# Patient Record
Sex: Female | Born: 1975 | Marital: Single | Smoking: Former smoker
Health system: Southern US, Community
[De-identification: ages and names within clinical notes are randomized; demographics above are authoritative.]

## PROBLEM LIST (undated history)

## (undated) DIAGNOSIS — D649 Anemia, unspecified: Secondary | ICD-10-CM

## (undated) DIAGNOSIS — I639 Cerebral infarction, unspecified: Secondary | ICD-10-CM

## (undated) DIAGNOSIS — F17200 Nicotine dependence, unspecified, uncomplicated: Secondary | ICD-10-CM

## (undated) DIAGNOSIS — E669 Obesity, unspecified: Secondary | ICD-10-CM

## (undated) DIAGNOSIS — E66812 Obesity, class 2: Secondary | ICD-10-CM

## (undated) DIAGNOSIS — F1010 Alcohol abuse, uncomplicated: Secondary | ICD-10-CM

## (undated) DIAGNOSIS — I10 Essential (primary) hypertension: Secondary | ICD-10-CM

## (undated) HISTORY — DX: Nicotine dependence, unspecified, uncomplicated: F17.200

## (undated) HISTORY — DX: Anemia, unspecified: D64.9

## (undated) HISTORY — DX: Obesity, class 2: E66.812

## (undated) HISTORY — DX: Obesity, unspecified: E66.9

## (undated) HISTORY — DX: Cerebral infarction, unspecified: I63.9

## (undated) HISTORY — DX: Alcohol abuse, uncomplicated: F10.10

## (undated) HISTORY — DX: Essential (primary) hypertension: I10

---

## 2017-08-15 ENCOUNTER — Encounter (HOSPITAL_COMMUNITY)
Admission: AD | Disposition: A | Discharge status: Home / Self Care | Payer: Self-pay | Source: Other Acute Inpatient Hospital | Attending: Neurology

## 2017-08-15 ENCOUNTER — Inpatient Hospital Stay (HOSPITAL_COMMUNITY)
Admission: AD | Admit: 2017-08-15 | Discharge: 2017-08-20 | DRG: 023 | Disposition: A | Discharge status: Home / Self Care | Payer: Medicaid Other | Source: Other Acute Inpatient Hospital | Attending: Neurology | Admitting: Neurology

## 2017-08-15 ENCOUNTER — Observation Stay (HOSPITAL_COMMUNITY): Payer: Medicaid Other

## 2017-08-15 ENCOUNTER — Inpatient Hospital Stay (HOSPITAL_COMMUNITY): Payer: Medicaid Other

## 2017-08-15 ENCOUNTER — Observation Stay (HOSPITAL_COMMUNITY): Payer: Medicaid Other | Admitting: Certified Registered"

## 2017-08-15 DIAGNOSIS — I10 Essential (primary) hypertension: Secondary | ICD-10-CM | POA: Diagnosis present

## 2017-08-15 DIAGNOSIS — I63411 Cerebral infarction due to embolism of right middle cerebral artery: Principal | ICD-10-CM | POA: Diagnosis present

## 2017-08-15 DIAGNOSIS — I639 Cerebral infarction, unspecified: Secondary | ICD-10-CM

## 2017-08-15 DIAGNOSIS — N3090 Cystitis, unspecified without hematuria: Secondary | ICD-10-CM | POA: Diagnosis present

## 2017-08-15 DIAGNOSIS — J9811 Atelectasis: Secondary | ICD-10-CM | POA: Diagnosis present

## 2017-08-15 DIAGNOSIS — E876 Hypokalemia: Secondary | ICD-10-CM | POA: Diagnosis not present

## 2017-08-15 DIAGNOSIS — I618 Other nontraumatic intracerebral hemorrhage: Secondary | ICD-10-CM | POA: Diagnosis not present

## 2017-08-15 DIAGNOSIS — I6601 Occlusion and stenosis of right middle cerebral artery: Secondary | ICD-10-CM | POA: Diagnosis present

## 2017-08-15 DIAGNOSIS — F1020 Alcohol dependence, uncomplicated: Secondary | ICD-10-CM | POA: Diagnosis present

## 2017-08-15 DIAGNOSIS — Z6837 Body mass index (BMI) 37.0-37.9, adult: Secondary | ICD-10-CM

## 2017-08-15 DIAGNOSIS — Z781 Physical restraint status: Secondary | ICD-10-CM

## 2017-08-15 DIAGNOSIS — E669 Obesity, unspecified: Secondary | ICD-10-CM | POA: Diagnosis present

## 2017-08-15 DIAGNOSIS — I63511 Cerebral infarction due to unspecified occlusion or stenosis of right middle cerebral artery: Secondary | ICD-10-CM

## 2017-08-15 DIAGNOSIS — F1010 Alcohol abuse, uncomplicated: Secondary | ICD-10-CM | POA: Diagnosis present

## 2017-08-15 DIAGNOSIS — N159 Renal tubulo-interstitial disease, unspecified: Secondary | ICD-10-CM | POA: Diagnosis present

## 2017-08-15 DIAGNOSIS — I959 Hypotension, unspecified: Secondary | ICD-10-CM | POA: Diagnosis not present

## 2017-08-15 DIAGNOSIS — Z9282 Status post administration of tPA (rtPA) in a different facility within the last 24 hours prior to admission to current facility: Secondary | ICD-10-CM | POA: Diagnosis not present

## 2017-08-15 DIAGNOSIS — D509 Iron deficiency anemia, unspecified: Secondary | ICD-10-CM | POA: Diagnosis present

## 2017-08-15 DIAGNOSIS — I63031 Cerebral infarction due to thrombosis of right carotid artery: Secondary | ICD-10-CM

## 2017-08-15 DIAGNOSIS — R29714 NIHSS score 14: Secondary | ICD-10-CM | POA: Diagnosis present

## 2017-08-15 DIAGNOSIS — E538 Deficiency of other specified B group vitamins: Secondary | ICD-10-CM | POA: Diagnosis present

## 2017-08-15 DIAGNOSIS — J9601 Acute respiratory failure with hypoxia: Secondary | ICD-10-CM | POA: Diagnosis present

## 2017-08-15 DIAGNOSIS — E785 Hyperlipidemia, unspecified: Secondary | ICD-10-CM | POA: Diagnosis present

## 2017-08-15 DIAGNOSIS — D62 Acute posthemorrhagic anemia: Secondary | ICD-10-CM | POA: Diagnosis not present

## 2017-08-15 DIAGNOSIS — F17200 Nicotine dependence, unspecified, uncomplicated: Secondary | ICD-10-CM | POA: Diagnosis present

## 2017-08-15 DIAGNOSIS — G8194 Hemiplegia, unspecified affecting left nondominant side: Secondary | ICD-10-CM | POA: Diagnosis present

## 2017-08-15 DIAGNOSIS — Z9911 Dependence on respirator [ventilator] status: Secondary | ICD-10-CM

## 2017-08-15 DIAGNOSIS — D72829 Elevated white blood cell count, unspecified: Secondary | ICD-10-CM | POA: Diagnosis present

## 2017-08-15 DIAGNOSIS — R0602 Shortness of breath: Secondary | ICD-10-CM

## 2017-08-15 DIAGNOSIS — N390 Urinary tract infection, site not specified: Secondary | ICD-10-CM | POA: Diagnosis present

## 2017-08-15 HISTORY — DX: Cerebral infarction, unspecified: I63.9

## 2017-08-15 HISTORY — DX: Occlusion and stenosis of right middle cerebral artery: I66.01

## 2017-08-15 HISTORY — PX: IR CT HEAD LTD: IMG2386

## 2017-08-15 HISTORY — PX: RADIOLOGY WITH ANESTHESIA: SHX6223

## 2017-08-15 HISTORY — PX: IR PERCUTANEOUS ART THROMBECTOMY/INFUSION INTRACRANIAL INC DIAG ANGIO: IMG6087

## 2017-08-15 LAB — LIPID PANEL
CHOL/HDL RATIO: 5.5 ratio
CHOLESTEROL: 127 mg/dL (ref 0–200)
HDL: 23 mg/dL — AB (ref >40)
LDL CALC: 62 mg/dL (ref 0–99)
Triglycerides: 208 mg/dL — ABNORMAL HIGH (ref <150)
VLDL: 42 mg/dL — AB (ref 0–40)

## 2017-08-15 LAB — COMPREHENSIVE METABOLIC PANEL
ALT: 17 U/L (ref 0–44)
AST: 22 U/L (ref 15–41)
Albumin: 2.7 g/dL — ABNORMAL LOW (ref 3.5–5.0)
Alkaline Phosphatase: 84 U/L (ref 38–126)
Anion gap: 8 (ref 5–15)
BUN: <5 mg/dL — ABNORMAL LOW (ref 6–20)
CO2: 19 mmol/L — ABNORMAL LOW (ref 22–32)
Calcium: 7.5 mg/dL — ABNORMAL LOW (ref 8.9–10.3)
Chloride: 112 mmol/L — ABNORMAL HIGH (ref 98–111)
Creatinine, Ser: 0.45 mg/dL (ref 0.44–1.00)
GFR calc Af Amer: >60 mL/min (ref >60)
GFR calc non Af Amer: >60 mL/min (ref >60)
Glucose, Bld: 109 mg/dL — ABNORMAL HIGH (ref 70–99)
POTASSIUM: 3.6 mmol/L (ref 3.5–5.1)
SODIUM: 139 mmol/L (ref 135–145)
Total Bilirubin: 0.3 mg/dL (ref 0.3–1.2)
Total Protein: 6.4 g/dL — ABNORMAL LOW (ref 6.5–8.1)

## 2017-08-15 LAB — GLUCOSE, CAPILLARY
GLUCOSE-CAPILLARY: 92 mg/dL (ref 70–99)
Glucose-Capillary: 92 mg/dL (ref 70–99)
Glucose-Capillary: 90 mg/dL (ref 70–99)

## 2017-08-15 LAB — POCT I-STAT 3, ART BLOOD GAS (G3+)
Acid-base deficit: 7 mmol/L — ABNORMAL HIGH (ref 0.0–2.0)
BICARBONATE: 18.5 mmol/L — AB (ref 20.0–28.0)
O2 Saturation: 97 %
PCO2 ART: 35.4 mmHg (ref 32.0–48.0)
Patient temperature: 98.7
TCO2: 20 mmol/L — AB (ref 22–32)
pH, Arterial: 7.325 — ABNORMAL LOW (ref 7.350–7.450)
pO2, Arterial: 92 mmHg (ref 83.0–108.0)

## 2017-08-15 LAB — IRON AND TIBC
Iron: 8 ug/dL — ABNORMAL LOW (ref 28–170)
SATURATION RATIOS: 2 % — AB (ref 10.4–31.8)
TIBC: 402 ug/dL (ref 250–450)
UIBC: 394 ug/dL

## 2017-08-15 LAB — FERRITIN: FERRITIN: 4 ng/mL — AB (ref 11–307)

## 2017-08-15 LAB — RAPID URINE DRUG SCREEN, HOSP PERFORMED
Amphetamines: NOT DETECTED
Barbiturates: NOT DETECTED
Benzodiazepines: POSITIVE — AB
Cocaine: NOT DETECTED
Opiates: NOT DETECTED
Tetrahydrocannabinol: NOT DETECTED

## 2017-08-15 LAB — HEMOGLOBIN A1C
Hgb A1c MFr Bld: 5.8 % — ABNORMAL HIGH (ref 4.8–5.6)
MEAN PLASMA GLUCOSE: 119.76 mg/dL

## 2017-08-15 LAB — MRSA PCR SCREENING: MRSA by PCR: NEGATIVE

## 2017-08-15 LAB — VITAMIN B12: Vitamin B-12: 156 pg/mL — ABNORMAL LOW (ref 180–914)

## 2017-08-15 LAB — RETICULOCYTES
RBC.: 3.9 MIL/uL (ref 3.87–5.11)
RETIC CT PCT: 1.4 % (ref 0.4–3.1)
Retic Count, Absolute: 54.6 10*3/uL (ref 19.0–186.0)

## 2017-08-15 LAB — TRIGLYCERIDES: TRIGLYCERIDES: 200 mg/dL — AB (ref <150)

## 2017-08-15 LAB — FOLATE: Folate: 12.5 ng/mL (ref >5.9)

## 2017-08-15 LAB — HIV ANTIBODY (ROUTINE TESTING W REFLEX): HIV SCREEN 4TH GENERATION: NONREACTIVE

## 2017-08-15 SURGERY — RADIOLOGY WITH ANESTHESIA
Anesthesia: General

## 2017-08-15 MED ORDER — NITROGLYCERIN 1 MG/10 ML FOR IR/CATH LAB
INTRA_ARTERIAL | Status: DC | PRN
  Administered 2017-08-15 (×5): 25 ug via INTRA_ARTERIAL

## 2017-08-15 MED ORDER — STROKE: EARLY STAGES OF RECOVERY BOOK
Freq: Once | Status: AC
  Administered 2017-08-15: 08:00:00
  Filled 2017-08-15: qty 1

## 2017-08-15 MED ORDER — ACETAMINOPHEN 650 MG RE SUPP: 650.0000 mg | RECTAL | Status: DC | PRN

## 2017-08-15 MED ORDER — VITAMIN B-1 100 MG PO TABS: 100.0000 mg | ORAL_TABLET | Freq: Every day | ORAL | Status: DC

## 2017-08-15 MED ORDER — SODIUM CHLORIDE 0.9 % IV SOLN
INTRAVENOUS | Status: DC | PRN
  Administered 2017-08-15: 04:00:00 via INTRAVENOUS

## 2017-08-15 MED ORDER — ACETAMINOPHEN 160 MG/5ML PO SOLN: 650.0000 mg | ORAL | Status: DC | PRN

## 2017-08-15 MED ORDER — PHENYLEPHRINE HCL-NACL 10-0.9 MG/250ML-% IV SOLN
0.0000 ug/min | INTRAVENOUS | Status: DC
  Filled 2017-08-15: qty 250

## 2017-08-15 MED ORDER — NICARDIPINE HCL IN NACL 20-0.86 MG/200ML-% IV SOLN: 0.0000 mg/h | INTRAVENOUS | Status: DC

## 2017-08-15 MED ORDER — ROCURONIUM BROMIDE 100 MG/10ML IV SOLN
INTRAVENOUS | Status: DC | PRN
  Administered 2017-08-15: 50 mg via INTRAVENOUS

## 2017-08-15 MED ORDER — PANTOPRAZOLE SODIUM 40 MG PO PACK
40.0000 mg | PACK | Freq: Every day | ORAL | Status: DC
  Administered 2017-08-15 – 2017-08-16 (×2): 40 mg
  Filled 2017-08-15 (×2): qty 20

## 2017-08-15 MED ORDER — FENTANYL CITRATE (PF) 100 MCG/2ML IJ SOLN
100.0000 ug | INTRAMUSCULAR | Status: DC | PRN
  Administered 2017-08-15 (×3): 100 ug via INTRAVENOUS
  Filled 2017-08-15 (×2): qty 2

## 2017-08-15 MED ORDER — ACETAMINOPHEN 325 MG PO TABS: 650.0000 mg | ORAL_TABLET | ORAL | Status: DC | PRN

## 2017-08-15 MED ORDER — CEFAZOLIN SODIUM-DEXTROSE 2-4 GM/100ML-% IV SOLN
INTRAVENOUS | Status: AC
  Filled 2017-08-15: qty 100

## 2017-08-15 MED ORDER — SODIUM CHLORIDE 0.9 % IV SOLN: 50.0000 mL/h | INTRAVENOUS | Status: DC

## 2017-08-15 MED ORDER — CEPHALEXIN 500 MG PO CAPS
500.0000 mg | ORAL_CAPSULE | Freq: Two times a day (BID) | ORAL | Status: DC
  Filled 2017-08-15: qty 1

## 2017-08-15 MED ORDER — FENTANYL CITRATE (PF) 100 MCG/2ML IJ SOLN
100.0000 ug | INTRAMUSCULAR | Status: AC | PRN
  Administered 2017-08-15 – 2017-08-16 (×3): 100 ug via INTRAVENOUS
  Filled 2017-08-15 (×5): qty 2

## 2017-08-15 MED ORDER — CEFAZOLIN SODIUM-DEXTROSE 2-3 GM-%(50ML) IV SOLR
INTRAVENOUS | Status: DC | PRN
  Administered 2017-08-15: 2 g via INTRAVENOUS

## 2017-08-15 MED ORDER — ADULT MULTIVITAMIN W/MINERALS CH: 1.0000 | ORAL_TABLET | Freq: Every day | ORAL | Status: DC

## 2017-08-15 MED ORDER — LABETALOL HCL 5 MG/ML IV SOLN: 10.0000 mg | Freq: Once | INTRAVENOUS | Status: DC | PRN

## 2017-08-15 MED ORDER — LORAZEPAM 1 MG PO TABS: 1.0000 mg | ORAL_TABLET | Freq: Four times a day (QID) | ORAL | Status: AC | PRN

## 2017-08-15 MED ORDER — IOHEXOL 300 MG/ML  SOLN
80.0000 mL | Freq: Once | INTRAMUSCULAR | Status: AC | PRN
  Administered 2017-08-16: 100 mL via INTRAVENOUS

## 2017-08-15 MED ORDER — PROPOFOL 1000 MG/100ML IV EMUL
5.0000 ug/kg/min | INTRAVENOUS | Status: DC
  Administered 2017-08-15 (×2): 50 ug/kg/min via INTRAVENOUS
  Administered 2017-08-15 (×2): 40 ug/kg/min via INTRAVENOUS
  Administered 2017-08-16: 20 ug/kg/min via INTRAVENOUS
  Administered 2017-08-16: 40 ug/kg/min via INTRAVENOUS
  Filled 2017-08-15 (×6): qty 100

## 2017-08-15 MED ORDER — CEPHALEXIN 250 MG/5ML PO SUSR
500.0000 mg | Freq: Two times a day (BID) | ORAL | Status: DC
  Filled 2017-08-15 (×2): qty 10

## 2017-08-15 MED ORDER — CEPHALEXIN 250 MG/5ML PO SUSR
500.0000 mg | Freq: Two times a day (BID) | ORAL | Status: DC
  Administered 2017-08-15 – 2017-08-16 (×2): 500 mg
  Filled 2017-08-15 (×4): qty 10

## 2017-08-15 MED ORDER — SODIUM CHLORIDE 0.9 % IV SOLN
INTRAVENOUS | Status: DC | PRN
  Administered 2017-08-15: 50 ug/min via INTRAVENOUS

## 2017-08-15 MED ORDER — THIAMINE HCL 100 MG/ML IJ SOLN
100.0000 mg | Freq: Every day | INTRAMUSCULAR | Status: DC
  Administered 2017-08-15: 100 mg via INTRAVENOUS
  Filled 2017-08-15: qty 2

## 2017-08-15 MED ORDER — FOLIC ACID 1 MG PO TABS: 1.0000 mg | ORAL_TABLET | Freq: Every day | ORAL | Status: DC

## 2017-08-15 MED ORDER — SODIUM CHLORIDE 0.9 % IV SOLN
INTRAVENOUS | Status: DC
  Administered 2017-08-15 – 2017-08-17 (×5): via INTRAVENOUS

## 2017-08-15 MED ORDER — INSULIN ASPART 100 UNIT/ML ~~LOC~~ SOLN: 0.0000 [IU] | SUBCUTANEOUS | Status: DC

## 2017-08-15 MED ORDER — CHLORHEXIDINE GLUCONATE 0.12% ORAL RINSE (MEDLINE KIT)
15.0000 mL | Freq: Two times a day (BID) | OROMUCOSAL | Status: DC
  Administered 2017-08-15 – 2017-08-17 (×4): 15 mL via OROMUCOSAL

## 2017-08-15 MED ORDER — PROPOFOL 500 MG/50ML IV EMUL
INTRAVENOUS | Status: DC | PRN
  Administered 2017-08-15: 30 ug/kg/min via INTRAVENOUS

## 2017-08-15 MED ORDER — ACETAMINOPHEN 160 MG/5ML PO SOLN
650.0000 mg | ORAL | Status: DC | PRN
  Administered 2017-08-16 – 2017-08-17 (×2): 650 mg
  Filled 2017-08-15 (×2): qty 20.3

## 2017-08-15 MED ORDER — LORAZEPAM 2 MG/ML IJ SOLN: 1.0000 mg | Freq: Four times a day (QID) | INTRAMUSCULAR | Status: AC | PRN

## 2017-08-15 MED ORDER — NICARDIPINE HCL IN NACL 20-0.86 MG/200ML-% IV SOLN: 0.0000 mg/h | INTRAVENOUS | Status: DC | PRN

## 2017-08-15 MED ORDER — ORAL CARE MOUTH RINSE
15.0000 mL | OROMUCOSAL | Status: DC
  Administered 2017-08-15 – 2017-08-18 (×16): 15 mL via OROMUCOSAL

## 2017-08-15 NOTE — Progress Notes (Signed)
SLP Cancellation Note  Patient Details Name: Debbrah AlarMaury Castillo Reynoso MRN: [id number] DOB: July 03, 1975   Cancelled treatment:       Reason Eval/Treat Not Completed: Medical issues which prohibited therapy. Pt now intubated.   Maxcine Hamaiewonsky, Kari Kerth 08/15/2017, 7:45 AM  Maxcine HamLaura Paiewonsky, M.A. CCC-SLP [phone]

## 2017-08-15 NOTE — Sedation Documentation (Signed)
8Fr sheath to remain in RFA. To be removed at noon 08/15/2017 by IR staff. Pt to remain intubated.

## 2017-08-15 NOTE — Sedation Documentation (Signed)
Groin and pulses assessed at bedside with Anette RiedelNoah, RN. See flowsheet.

## 2017-08-15 NOTE — Transfer of Care (Signed)
Immediate Anesthesia Transfer of Care Note  Patient: Stacie Marshall  Procedure(s) Performed: RADIOLOGY WITH ANESTHESIA (N/A )  Patient Location: ICU  Anesthesia Type:General  Level of Consciousness: Patient remains intubated per anesthesia plan  Airway & Oxygen Therapy: Patient remains intubated per anesthesia plan and Patient placed on Ventilator (see vital sign flow sheet for setting)  Post-op Assessment: Report given to RN and Post -op Vital signs reviewed and stable  Post vital signs: Reviewed and stable  Last Vitals:  Vitals Value Taken Time  BP 122/101 08/15/2017  6:30 AM  Temp    Pulse 98 08/15/2017  6:32 AM  Resp 15 08/15/2017  6:32 AM  SpO2 100 % 08/15/2017  6:32 AM  Vitals shown include unvalidated device data.  Last Pain: There were no vitals filed for this visit.       Complications: No apparent anesthesia complications

## 2017-08-15 NOTE — Procedures (Signed)
Intubation Procedure Note Debbrah AlarMaury Castillo Reynoso [id number] Jul 11, 1975  Procedure: Intubation Indications: Airway protection and maintenance  Procedure Details Consent: Unable to obtain consent because of emergent medical necessity. Time Out: Verified patient identification, verified procedure, site/side was marked, verified correct patient position, special equipment/implants available, medications/allergies/relevent history reviewed, required imaging and test results available.  Performed  Maximum sterile technique was used including antiseptics.  Miller    Evaluation Hemodynamic Status: BP stable throughout; O2 sats: stable throughout Patient's Current Condition: stable Complications: No apparent complications Patient did tolerate procedure well. Chest X-ray ordered to verify placement.  CXR: tube position acceptable.   Rushie ChestnutReid, Manases Etch[place] [facility] 08/15/2017

## 2017-08-15 NOTE — Sedation Documentation (Signed)
SBAR called to [employer], Charity fundraiserN.

## 2017-08-15 NOTE — Procedures (Signed)
S/P rt common carotid arteriogram followed by complete revascularization of occluded Rt MCA dominant inf division with x 1 pass with embotrap 5 mmm x 33mm device achieving a TICI 3 reperfusion

## 2017-08-15 NOTE — Progress Notes (Signed)
OT Cancellation Note  Patient Details Name: Debbrah AlarMaury Castillo Reynoso MRN: [id number] DOB: 1975/09/01   Cancelled Treatment:    Reason Eval/Treat Not Completed: Medical issues which prohibited therapy;Patient not medically ready. Pt intubated and sedated. Will check back as appropriate.   Doristine Sectionharity A Sasan Wilkie, MS OTR/L  Pager: [phone]   Alayne Estrella A Crockett Rallo 08/15/2017, 11:25 AM

## 2017-08-15 NOTE — H&P (Signed)
Neurology H&P  CC: Left-sided weakness  History is obtained from: Patient  HPI: Stacie Marshall is a 42 y.o. female with no significant past medical history who presents with acute left-sided weakness that started around 11:30 PM.  She was in her normal state of health earlier tonight, but had been drinking.  She then was seen to collapse, and subsequently was noted to have left-sided weakness.  [facility] where tele-neurology and gave IV TPA.  She was agitated and in order to get a CTA, she had to be intubated and therefore was intuabted for transport.   LKW: 11:30 PM tpa given?:  Yes[facility] Modified Rankin Scale: 0-Completely asymptomatic and back to baseline post- stroke NIHSS: 14  ROS:  Unable to obtain due to altered mental status.   PMH: None   FHx: No history of stroke of heart attacks at young age as far as her 41 yo son knows.   Social History: +smoker Drinks 5-10 beers daily   Exam: Current vital signs: There were no vitals filed for this visit. Vital signs in last 24 hours:    Physical Exam  Constitutional: Appears well-developed and well-nourished.  Psych: Agitated Eyes: No scleral injection HENT: Intubated Head: Normocephalic.  Cardiovascular: Normal rate and regular rhythm.  Respiratory: Ventilated, breath sounds normal to anterior ascultation GI: Soft.  No distension. There is no tenderness.  Skin: WDI  Neuro: Mental Status: Patient is agitated, does not follow commands, likely due to combination of agitation and propofol.  Cranial Nerves: II: Does not blink to threat from either side.  Pupils are equal, round, and reactive to light.   III,IV, VI: Eyes are midline, too awake to check dolls eye and does not cooperate for EOM testing.  VII: ? Mild left facial droop, difficult to tell due ot ET tube.  Motor: She has 2/5 left arm strength and 3/5 left leg Sensory: Responds to nox stim on left, but less than  on right.  Cerebellar: Does not perform.   I have reviewed labs in epic and the results pertinent to this consultation are: EtOH: 0.19 Na 138 K 3.9 Cl 105 CO2 22 BUN 4 Cr 0.5 Glucose 104  WBC 10.3 HgB 8.1 HcT 26.1 PLT 498 MCV 57  I have reviewed the images obtained:CTA - R M2 occlusion.   Primary Diagnosis:  Cerebral infarction due to embolism of right middle cerebral artery  Secondary Diagnosis: Acute Respiratory Failure   Impression: 42 yo F with R M2 occlusion. She has received IV tPA and is within the timeframe for thrombectomy.   Recommendations: Stroke: - to IR for thrombectomy - HgbA1c, fasting lipid panel - MRI  of the brain without contrast - Frequent neuro checks - Echocardiogram - Prophylactic therapy-Antiplatelet med: none for 24 hours - Risk factor modification - Telemetry monitoring - PT consult, OT consult, Speech consult - Stroke team to follow  EtOH:  CIWA  Anemia: - unclear etiology, likley iron defeciency - anemia panel.   This patient is critically ill and at significant risk of neurological worsening, death and care requires constant monitoring of vital signs, hemodynamics,respiratory and cardiac monitoring, neurological assessment, discussion with family, other specialists and medical decision making of high complexity. I spent 60 minutes of neurocritical care time  in the care of  this patient.  Ritta SlotMcNeill Yaritzy Huser, MD [facility] [phone]  If 7pm- 7am, please page neurology on call as listed in AMION. 08/15/2017  4:40 AM

## 2017-08-15 NOTE — Progress Notes (Signed)
PT Cancellation Note  Patient Details Name: Stacie Marshall MRN: [id number] DOB: 1975/10/11   Cancelled Treatment:    Reason Eval/Treat Not Completed: Medical issues which prohibited therapy, transferred and intubated   Fabio AsaDevon J Clair Bardwell 08/15/2017, 6:51 AM

## 2017-08-15 NOTE — Progress Notes (Signed)
Patient ID: Stacie AlarMaury Castillo Marshall, female   DOB: 11/18/1975, 42 y.o.   MRN: [id number] INR 41 y RT H F LSW 11 30 pm. Pre morbid MRSS  0 Sudden collapse with Lt sided weakness and confusion. IVTPA given. Intubated. CT brain no ICH .ASPECTS ? Motion artifact. CTA occluded RT MCA  Dominant inf division.  Patient deemed candidate for  endovascular revascularization to prevent further neurological injury. Discussed with son.Procedure,reasons,risks alternatives all reviewed. Risks of ICH of 10% with worsening neurological condition,vent dependency, death, inability to revascularize and  vascular injury all reviewed. Questions answered to sons satisfaction. Informed witnessed consent obtained for endovascular revascularization. S.Adriene Padula MD

## 2017-08-15 NOTE — Anesthesia Procedure Notes (Signed)
Arterial Line Insertion Start/End7/28/2019 4:30 AM, 08/15/2017 4:35 AM Performed by: Leonides GrillsEllender, Jesicca Dipierro P, MD, anesthesiologist  Patient location: OR. Preanesthetic checklist: patient identified, IV checked, site marked, risks and benefits discussed, surgical consent, monitors and equipment checked, pre-op evaluation, timeout performed and anesthesia consent Emergency situation Left, radial was placed Catheter size: 20 Fr Hand hygiene performed  and maximum sterile barriers used   Attempts: 2 Procedure performed without using ultrasound guided technique. Following insertion, dressing applied and Biopatch. Post procedure assessment: normal and unchanged  Patient tolerated the procedure well with no immediate complications. Additional procedure comments: Previous attempts by CRNA unsuccessful. .Marland Kitchen

## 2017-08-15 NOTE — Progress Notes (Signed)
STROKE TEAM PROGRESS NOTE   HISTORY OF PRESENT ILLNESS (per record) Stacie Marshall is a 42 y.o. female with no significant past medical history who presents with acute left-sided weakness that started around 11:30 PM.  She was in her normal state of health earlier tonight, but had been drinking.  She then was seen to collapse, and subsequently was noted to have left-sided weakness.  [facility] where tele-neurology and gave IV TPA.  She was agitated and in order to get a CTA, she had to be intubated and therefore was intuabted for transport.   LKW: 11:30 PM tpa given?:  Yes[facility] Modified Rankin Scale: 0-Completely asymptomatic and back to baseline post- stroke NIHSS: 14     SUBJECTIVE (INTERVAL HISTORY) Hypotensive overnight.  Intubated.  S/P IV tPA and mechanical thrombectomy right M2.  LDL 62, TG 206, A1c 5.8.  MRI/TTE/CDUS pending.      OBJECTIVE Temp:  [98.2 F (36.8 C)] 98.2 F (36.8 C) (07/28 0800) Pulse Rate:  [79-96] 79 (07/28 0815) Cardiac Rhythm: Normal sinus rhythm (07/28 0800) Resp:  [16-24] 18 (07/28 0815) BP: (94-122)/(67-101) 96/67 (07/28 0800) SpO2:  [100 %] 100 % (07/28 0815) Arterial Line BP: (106-134)/(58-82) 112/65 (07/28 0815) FiO2 (%):  [40 %] 40 % (07/28 0800) Weight:  [185 lb 13.6 oz (84.3 kg)-190 lb (86.2 kg)] 185 lb 13.6 oz (84.3 kg) (07/28 0700)  CBC: No results for input(s): WBC, NEUTROABS, HGB, HCT, MCV, PLT in the last 168 hours.  Basic Metabolic Panel:  Recent Labs  Lab 08/15/17 0728  NA 139  K 3.6  CL 112*  CO2 19*  GLUCOSE 109*  BUN <5*  CREATININE 0.45  CALCIUM 7.5*    Lipid Panel:     Component Value Date/Time   CHOL 127 08/15/2017 0729   TRIG 208 (H) 08/15/2017 0729   TRIG 200 (H) 08/15/2017 0729   HDL 23 (L) 08/15/2017 0729   CHOLHDL 5.5 08/15/2017 0729   VLDL 42 (H) 08/15/2017 0729   LDLCALC 62 08/15/2017 0729   HgbA1c:  Lab Results  Component Value Date   HGBA1C  5.8 (H) 08/15/2017   Urine Drug Screen: No results found for: LABOPIA, COCAINSCRNUR, LABBENZ, AMPHETMU, THCU, LABBARB  Alcohol Level No results found for: [facility]  IMAGING   Dg Chest Port 1 View 08/15/2017 IMPRESSION:  Tubes and lines as described above. Patchy left basilar atelectasis.    MRI Brain WO Contrast - pending   Transthoracic Echocardiogram - pending 00/00/00   IR Thrombectomy 08/15/2017 S/P rt common carotid arteriogram followed by complete revascularization of occluded Rt MCA dominant inf division with x 1 pass with embotrap 5 mmm x 33mm device achieving a TICI 3 reperfusion      PHYSICAL EXAM Vitals:   08/15/17 0730 08/15/17 0745 08/15/17 0800 08/15/17 0815  BP:   96/67   Pulse: 84 81 80 79  Resp: 20 19 18 18   Temp:   98.2 F (36.8 C)   TempSrc:   Axillary   SpO2: 100% 100% 100% 100%  Weight:      Height:        Intubated on Propofol 40 mcg/kg/min; does nodd appropriately after stopping Propofol.   PERL. Positive gag.  Positive corneals.  EOMI Grip strong with right hand.  No grip with left hand.  Minimal shoulder movement with LUE, but normal on the RUE.  Moves BLE well and bends knees and wiggles toes.   No babinski.  No hoffman's.  ASSESSMENT/PLAN [name] is a 42 y.o. female with no significant past medical history presenting with left sided weakness. She received  IV TPA per tele-neurology at [facility].  She underwent thrombectomy Rt MCA dominant inf division.  Stroke:  R MCA - MRI pending  Resultant  Left side weakness  CT head - pending  MRI head - pending  MRA head - not performed  Carotid Doppler - pending  2D Echo - pending  LDL - 62  HgbA1c - 5.8  VTE prophylaxis - SCDs Diet Order           Diet NPO time specified  Diet effective now          No antithrombotic prior to admission, now on No antithrombotic S/P TPA  Patient counseled to be compliant with her antithrombotic  medications  Ongoing aggressive stroke risk factor management  Therapy recommendations:  pending  Disposition:  Pending  Hypertension  Hypotensive -> phenylephrine -> decrease diprivan . Permissive hypertension (OK if < 220/120) but gradually normalize in 5-7 days . Long-term BP goal normotensive  Hyperlipidemia  Lipid lowering medication PTA:  none  LDL 62, goal < 70  Current lipid lowering medication: add statin when PO access is available  Continue statin at discharge   Other Stroke Risk Factors  Obesity, Body mass index is 36.3 kg/m., recommend weight loss, diet and exercise as appropriate    Other Active Problems  Cephalexin 500 mg PO Q 12 hours.  Possible ETOH hx - on CIWA protocol   PLAN  Check UDS  Hypotension  F/U MRI - asa if no hemorrhage.   Hospital day # 0  Acute right M2 occlusion s/p IV tPA and mechanical thrombectomy.  The LLE seems to be doing well, but still weak in the LUE.  Exam will be more accurate once she is extubated and off ventilator/sedation.  Only clear risk factor is hypertension, although cardioembolism is high in the differential too.    She is very hypotensive, which will affect cerebral flow to the ischemic areas.  May be related to prior medication administration.  Will increase IVF rate.  Neosynephrine is available if SBP drops too low.  Ideally, would like to wean off Propofol and extubate soon, but may need to keep this way until after imaging done late tonight due to her agitation during last imaging.    Weston Settle, MS, MD    To contact Stroke Continuity provider, please refer to [email or web address]. After hours, contact General Neurology

## 2017-08-15 NOTE — Consult Note (Addendum)
PULMONARY / CRITICAL CARE MEDICINE   Name: Stacie Marshall MRN: [id number] DOB: 10/08/1975    ADMISSION DATE:  08/15/2017 CONSULTATION DATE:  08/15/2017  REFERRING MD:  Dr. Amada JupiterKirkpatrick   CHIEF COMPLAINT:  CVA  HISTORY OF PRESENT ILLNESS:   42 year old female with PMH of ETOH.  [facility] with acute left side weakness. Given TPA. CTA with Right M2 Occlusion. [facility]. PCCM asked to consult for vent management.     PAST MEDICAL HISTORY :  She  has no past medical history on file.  PAST SURGICAL HISTORY: She  has no past surgical history on file.  No Known Allergies  No current facility-administered medications on file prior to encounter.    No current outpatient medications on file prior to encounter.    FAMILY HISTORY:  Her family history is not on file.  SOCIAL HISTORY: She    REVIEW OF SYSTEMS:   Unable to review as patient is intubated/sedated   SUBJECTIVE:   VITAL SIGNS: There were no vitals taken for this visit.  HEMODYNAMICS:    VENTILATOR SETTINGS:    INTAKE / OUTPUT: No intake/output data recorded.  PHYSICAL EXAMINATION: General:  Adult female, on vent  Neuro:  Sedated, does not follow commands HEENT:  ETT in place  Cardiovascular:  RRR, no MRG Lungs:  Clear breath sounds, no wheeze/crackles  Abdomen:  Obese, active bowel sounds  Musculoskeletal:  -edema  Skin:  Warm, dry   LABS:  BMET No results for input(s): NA, K, CL, CO2, BUN, CREATININE, GLUCOSE in the last 168 hours.  Electrolytes No results for input(s): CALCIUM, MG, PHOS in the last 168 hours.  CBC No results for input(s): WBC, HGB, HCT, PLT in the last 168 hours.  Coag's No results for input(s): APTT, INR in the last 168 hours.  Sepsis Markers No results for input(s): LATICACIDVEN, PROCALCITON, O2SATVEN in the last 168 hours.  ABG No results for input(s): PHART, PCO2ART, PO2ART in the last 168 hours.  Liver  Enzymes No results for input(s): AST, ALT, ALKPHOS, BILITOT, ALBUMIN in the last 168 hours.  Cardiac Enzymes No results for input(s): TROPONINI, PROBNP in the last 168 hours.  Glucose No results for input(s): GLUCAP in the last 168 hours.  Imaging No results found.   STUDIES:  MRI Brain 7/28 >> ECHO 7/28 >>  CULTURES: None.   ANTIBIOTICS: Ancef 7/28   SIGNIFICANT EVENTS: 7/27 > Presents to ED   LINES/TUBES: ETT 7/27 >>  Right Arterial Sheath 7/27 >>   DISCUSSION: 42 year old with right M2 Occlusion s/p Thrombectomy. Remains intubated.   ASSESSMENT / PLAN:  Respiratory Insufficieny s/p CVA Plan  -Vent Support  -Trend ABG/CXR  -Pulmonary Hygiene  -VAP Bundle -Wean in AM > Plans to Remove Sheath at noon, can extubate after if passes SBT   HTN Plan  -Cardiac Monitoring -Maintain Systolic 120-140 (Use Neo/Cardene as needed) -ECHO pending   Right M2 Occlusion s/p Thrombectomy  Sedation Needs  H/O ETOH  Plan  -Per Neurology  -PT/OT/ST -Frequent Neuro Checks  -MRI pending  -Folic Acid/Thimine  -PRN Fentanyl   SUP Plan  -NPO -PPI   Anemia  Plan -Trend CBC -Iron panel pending    FAMILY  - Updates: Family updated at bedside.   - Inter-disciplinary family meet or Palliative Care meeting due by: 08/22/2017   Jovita KussmaulKatalina Damontre Millea, AGACNP-BC [facility]  Pgr: [phone]  PCCM Pgr: [phone]

## 2017-08-15 NOTE — Anesthesia Preprocedure Evaluation (Addendum)
Anesthesia Evaluation  Patient identified by MRN, date of birth, ID band Patient unresponsive    Reviewed: Patient's Chart, lab work & pertinent test results, Unable to perform ROS - Chart review onlyPreop documentation limited or incomplete due to emergent nature of procedure.  Airway Mallampati: Intubated      Comment: 7.0 oral ETT Dental   Pulmonary Current Smoker,  Intubated          Cardiovascular      Neuro/Psych Depression Left sided weakness Acute right M2 occulusion     GI/Hepatic (+)     substance abuse  alcohol use,   Endo/Other    Renal/GU      Musculoskeletal   Abdominal   Peds  Hematology  (+) anemia ,   Anesthesia Other Findings CODE STROKE  Reproductive/Obstetrics                             Anesthesia Physical Anesthesia Plan  ASA: III and emergent  Anesthesia Plan: General   Post-op Pain Management:    Induction: Intravenous  PONV Risk Score and Plan: 3 and Treatment may vary due to age or medical condition  Airway Management Planned: Oral ETT  Additional Equipment: Arterial line  Intra-op Plan:   Post-operative Plan: Post-operative intubation/ventilation  Informed Consent: I have reviewed the patients History and Physical, chart, labs and discussed the procedure including the risks, benefits and alternatives for the proposed anesthesia with the patient or authorized representative who has indicated his/her understanding and acceptance.   Dental advisory given  Plan Discussed with: CRNA  Anesthesia Plan Comments:         Anesthesia Quick Evaluation

## 2017-08-15 NOTE — Progress Notes (Signed)
28fr sheath removed from right groin at 1409 hrs using manuel pressure to achieve hemostasis.  Hemostasis achieved at 1435 hrs.  Distal pulses intact.  No hemotoma.  Groin site reviewed by pt's RN Tammy.    [facility] Hines Rt-R [facility]ory [place]ta [place]

## 2017-08-16 ENCOUNTER — Inpatient Hospital Stay (HOSPITAL_COMMUNITY): Payer: Medicaid Other

## 2017-08-16 ENCOUNTER — Encounter (HOSPITAL_COMMUNITY): Payer: Self-pay | Admitting: Interventional Radiology

## 2017-08-16 DIAGNOSIS — I639 Cerebral infarction, unspecified: Secondary | ICD-10-CM

## 2017-08-16 DIAGNOSIS — E538 Deficiency of other specified B group vitamins: Secondary | ICD-10-CM

## 2017-08-16 DIAGNOSIS — Z9911 Dependence on respirator [ventilator] status: Secondary | ICD-10-CM

## 2017-08-16 DIAGNOSIS — R0602 Shortness of breath: Secondary | ICD-10-CM

## 2017-08-16 DIAGNOSIS — I63419 Cerebral infarction due to embolism of unspecified middle cerebral artery: Secondary | ICD-10-CM

## 2017-08-16 DIAGNOSIS — F1020 Alcohol dependence, uncomplicated: Secondary | ICD-10-CM

## 2017-08-16 LAB — GLUCOSE, CAPILLARY
GLUCOSE-CAPILLARY: 90 mg/dL (ref 70–99)
GLUCOSE-CAPILLARY: 101 mg/dL — AB (ref 70–99)
GLUCOSE-CAPILLARY: 83 mg/dL (ref 70–99)
Glucose-Capillary: 107 mg/dL — ABNORMAL HIGH (ref 70–99)
Glucose-Capillary: 94 mg/dL (ref 70–99)
Glucose-Capillary: 89 mg/dL (ref 70–99)
Glucose-Capillary: 97 mg/dL (ref 70–99)

## 2017-08-16 LAB — BASIC METABOLIC PANEL
ANION GAP: 10 (ref 5–15)
BUN: <5 mg/dL — ABNORMAL LOW (ref 6–20)
CALCIUM: 8.1 mg/dL — AB (ref 8.9–10.3)
CHLORIDE: 106 mmol/L (ref 98–111)
CO2: 22 mmol/L (ref 22–32)
Creatinine, Ser: 0.39 mg/dL — ABNORMAL LOW (ref 0.44–1.00)
GFR calc non Af Amer: >60 mL/min (ref >60)
GLUCOSE: 114 mg/dL — AB (ref 70–99)
POTASSIUM: 3.2 mmol/L — AB (ref 3.5–5.1)
Sodium: 138 mmol/L (ref 135–145)

## 2017-08-16 LAB — CBC WITH DIFFERENTIAL/PLATELET
BASOS ABS: 0 10*3/uL (ref 0.0–0.1)
Basophils Relative: 0 %
EOS ABS: 0.1 10*3/uL (ref 0.0–0.7)
Eosinophils Relative: 1 %
HEMATOCRIT: 21.7 % — AB (ref 36.0–46.0)
HEMOGLOBIN: 5.9 g/dL — AB (ref 12.0–15.0)
LYMPHS PCT: 21 %
Lymphs Abs: 2.1 10*3/uL (ref 0.7–4.0)
MCH: 16.8 pg — ABNORMAL LOW (ref 26.0–34.0)
MCHC: 27.2 g/dL — ABNORMAL LOW (ref 30.0–36.0)
MCV: 61.6 fL — ABNORMAL LOW (ref 78.0–100.0)
MONOS PCT: 9 %
Monocytes Absolute: 0.9 10*3/uL (ref 0.1–1.0)
NEUTROS PCT: 69 %
Neutro Abs: 7 10*3/uL (ref 1.7–7.7)
Platelets: 382 10*3/uL (ref 150–400)
RBC: 3.52 MIL/uL — AB (ref 3.87–5.11)
RDW: 21.2 % — ABNORMAL HIGH (ref 11.5–15.5)
WBC: 10.1 10*3/uL (ref 4.0–10.5)

## 2017-08-16 LAB — ECHOCARDIOGRAM COMPLETE
HEIGHTINCHES: 60 in
WEIGHTICAEL: 3093.49 [oz_av]

## 2017-08-16 LAB — POCT I-STAT 3, ART BLOOD GAS (G3+)
ACID-BASE EXCESS: 1 mmol/L (ref 0.0–2.0)
Bicarbonate: 24 mmol/L (ref 20.0–28.0)
O2 SAT: 95 %
PCO2 ART: 32.5 mmHg (ref 32.0–48.0)
TCO2: 25 mmol/L (ref 22–32)
pH, Arterial: 7.477 — ABNORMAL HIGH (ref 7.350–7.450)
pO2, Arterial: 71 mmHg — ABNORMAL LOW (ref 83.0–108.0)

## 2017-08-16 LAB — HEMOGLOBIN AND HEMATOCRIT, BLOOD
HEMATOCRIT: 28.2 % — AB (ref 36.0–46.0)
Hemoglobin: 8.3 g/dL — ABNORMAL LOW (ref 12.0–15.0)

## 2017-08-16 LAB — SEDIMENTATION RATE: Sed Rate: 25 mm/hr — ABNORMAL HIGH (ref 0–22)

## 2017-08-16 LAB — ABO/RH: ABO/RH(D): B POS

## 2017-08-16 LAB — MAGNESIUM: MAGNESIUM: 2 mg/dL (ref 1.7–2.4)

## 2017-08-16 LAB — ANTITHROMBIN III: AntiThromb III Func: 80 % (ref 75–120)

## 2017-08-16 LAB — C-REACTIVE PROTEIN: CRP: 5.7 mg/dL — ABNORMAL HIGH (ref <1.0)

## 2017-08-16 LAB — PREPARE RBC (CROSSMATCH)

## 2017-08-16 MED ORDER — ATORVASTATIN CALCIUM 10 MG PO TABS
10.0000 mg | ORAL_TABLET | Freq: Every day | ORAL | Status: DC
  Filled 2017-08-16: qty 1

## 2017-08-16 MED ORDER — SODIUM CHLORIDE 0.9% IV SOLUTION: Freq: Once | INTRAVENOUS | Status: DC

## 2017-08-16 MED ORDER — POTASSIUM CHLORIDE 10 MEQ/100ML IV SOLN
INTRAVENOUS | Status: AC
  Filled 2017-08-16: qty 100

## 2017-08-16 MED ORDER — ADULT MULTIVITAMIN W/MINERALS CH
1.0000 | ORAL_TABLET | Freq: Every day | ORAL | Status: DC
  Administered 2017-08-16: 1
  Filled 2017-08-16: qty 1

## 2017-08-16 MED ORDER — VITAMIN B-1 100 MG PO TABS
100.0000 mg | ORAL_TABLET | Freq: Every day | ORAL | Status: DC
  Administered 2017-08-16: 100 mg
  Filled 2017-08-16: qty 1

## 2017-08-16 MED ORDER — THIAMINE HCL 100 MG/ML IJ SOLN
100.0000 mg | Freq: Every day | INTRAMUSCULAR | Status: DC
  Filled 2017-08-16: qty 2

## 2017-08-16 MED ORDER — POTASSIUM CHLORIDE 10 MEQ/50ML IV SOLN
10.0000 meq | INTRAVENOUS | Status: DC
  Filled 2017-08-16: qty 50

## 2017-08-16 MED ORDER — ATORVASTATIN CALCIUM 10 MG PO TABS: 10.0000 mg | ORAL_TABLET | Freq: Every day | ORAL | Status: DC

## 2017-08-16 MED ORDER — FOLIC ACID 1 MG PO TABS
1.0000 mg | ORAL_TABLET | Freq: Every day | ORAL | Status: DC
  Administered 2017-08-16: 1 mg
  Filled 2017-08-16: qty 1

## 2017-08-16 MED ORDER — ASPIRIN 325 MG PO TABS: 325.0000 mg | ORAL_TABLET | Freq: Every day | ORAL | Status: DC

## 2017-08-16 MED ORDER — FERROUS SULFATE 300 (60 FE) MG/5ML PO SYRP
300.0000 mg | ORAL_SOLUTION | Freq: Three times a day (TID) | ORAL | Status: DC
  Administered 2017-08-16 (×2): 300 mg
  Filled 2017-08-16 (×4): qty 5

## 2017-08-16 MED ORDER — PANTOPRAZOLE SODIUM 40 MG PO TBEC
40.0000 mg | DELAYED_RELEASE_TABLET | Freq: Two times a day (BID) | ORAL | Status: DC
  Administered 2017-08-17 – 2017-08-19 (×4): 40 mg via ORAL
  Filled 2017-08-16 (×6): qty 1

## 2017-08-16 MED ORDER — CYANOCOBALAMIN 1000 MCG/ML IJ SOLN
1000.0000 ug | Freq: Every day | INTRAMUSCULAR | Status: DC
  Administered 2017-08-16: 1000 ug via INTRAMUSCULAR
  Filled 2017-08-16 (×2): qty 1

## 2017-08-16 MED ORDER — FERROUS SULFATE 325 (65 FE) MG PO TABS: 325.0000 mg | ORAL_TABLET | Freq: Three times a day (TID) | ORAL | Status: DC

## 2017-08-16 MED ORDER — ASPIRIN 325 MG PO TABS
325.0000 mg | ORAL_TABLET | Freq: Every day | ORAL | Status: DC
Start: 2017-08-16 — End: 2017-08-16
  Administered 2017-08-16: 325 mg via ORAL
  Filled 2017-08-16: qty 1

## 2017-08-16 MED ORDER — PNEUMOCOCCAL VAC POLYVALENT 25 MCG/0.5ML IJ INJ: 0.5000 mL | INJECTION | INTRAMUSCULAR | Status: DC | PRN

## 2017-08-16 MED ORDER — POTASSIUM CHLORIDE 10 MEQ/100ML IV SOLN
10.0000 meq | INTRAVENOUS | Status: AC
  Administered 2017-08-16 (×4): 10 meq via INTRAVENOUS
  Filled 2017-08-16 (×3): qty 100

## 2017-08-16 NOTE — Progress Notes (Signed)
Patient's ABG on a SPT was good - Patient was extubated today morning - Leak test was checked was borderline but patient was doing well so patient was extubated -Post extubation patient is doing well -OT PT/speech consultation per neurology - We will continue to follow from CCM point of view today  Stacie Marshall [facility] Care [facility]

## 2017-08-16 NOTE — Progress Notes (Addendum)
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 Corliss Skains. Patient awake and alert laying in bed intubated and sedated. Accompanied by son at bedside. Can spontaneously move all extremities but does not follow commands with left side. Right groin incision c/d/i.  MRI brain 08/16/2017: 1. Acute small to moderate RIGHT frontoparietal/MCA territory infarct with petechial hemorrhage.   Allergies: Patient has no known allergies.  Medications: Prior to Admission medications   Medication Sig Start Date End Date Taking? Authorizing Provider  cephALEXin (KEFLEX) 500 MG capsule Take 500 mg by mouth every 8 (eight) hours.   Yes [provider]     Vital Signs: BP (!) 155/77   Pulse 79   Temp 99.3 F (37.4 C) (Axillary)   Resp (!) 24   Ht 5' (1.524 m)   Wt 193 lb 5.5 oz (87.7 kg)   SpO2 96%   BMI 37.76 kg/m   Physical Exam  Constitutional: She appears well-developed and well-nourished. No distress.  Intubated and sedated.  Cardiovascular: Normal rate, regular rhythm and normal heart sounds.  No murmur heard. Pulmonary/Chest: Effort normal and breath sounds normal. No respiratory distress. She has no wheezes.  Intubated and sedated.  Neurological:  Alert and awake, intubated and sedated. PERRL bilaterally. EOMs intact bilaterally without nystagmus or subjective diplopia. Visual fields not assessed. No facial asymmetry. Tongue protrusion not assessed. Can spontaneously move all extremities but does not follow commands with left side. Pronator drift not assessed. Fine motor and coordination not assessed. Gait not assessed. Romberg not assessed. Heel to toe not assessed. Distal pulses 2+ bilaterally.  Skin: Skin is warm and dry.  Right groin incision soft without active bleeding or  hematoma.  Psychiatric:  Intubated and sedated.    Imaging: Mr Brain Wo Contrast  Result Date: 08/16/2017 CLINICAL DATA:  Acute onset LEFT-sided weakness. Follow up stroke. Status post endovascular revascularization of occluded RIGHT MCA. EXAM: MRI HEAD WITHOUT CONTRAST TECHNIQUE: Multiplanar, multiecho pulse sequences of the brain and surrounding structures were obtained without intravenous contrast. COMPARISON:  CT HEAD August 07, 2017 FINDINGS: INTRACRANIAL CONTENTS: Confluent reduced diffusion RIGHT frontal lobes including insula and operculum with a few subcentimeter foci reduced diffusion RIGHT parietal lobe, all areas demonstrate low ADC values. Faint susceptibility artifact RIGHT frontal lobe and area of reduced diffusion. If no susceptibility artifact to suggest lobar hematoma. No midline shift or significant mass effect. No parenchymal brain volume loss for age. No hydrocephalus. No abnormal extra-axial fluid collections. VASCULAR: Normal major intracranial vascular flow voids present at skull base. SKULL AND UPPER CERVICAL SPINE: No abnormal sellar expansion. No suspicious calvarial bone marrow signal. Craniocervical junction maintained. SINUSES/ORBITS: Mild paranasal sinus mucosal thickening with small maxillary sinus mucosal retention cyst. Bilateral mastoid effusions. Layering secretions in the pharynx. Included ocular globes and orbital contents are non-suspicious. OTHER: Life-support lines in place. IMPRESSION: Acute small to moderate RIGHT frontoparietal/MCA territory infarct with petechial hemorrhage. Electronically Signed   By: Awilda Metro M.D.   On: 08/16/2017 02:00   Dg Chest Port 1 View  Result Date: 08/15/2017 CLINICAL DATA:  Check endotracheal tube placement EXAM: PORTABLE CHEST 1 VIEW COMPARISON:  None. FINDINGS: Endotracheal tube and nasogastric catheter are noted in satisfactory position. Cardiac shadow is accentuated by the portable technique. The lungs are well aerated  bilaterally. Patchy atelectatic changes are noted in the left base. No bony  abnormality is noted. IMPRESSION: Tubes and lines as described above. Patchy left basilar atelectasis. Electronically Signed   By: Alcide CleverMark  Lukens M.D.   On: 08/15/2017 07:12    Labs:  CBC: Recent Labs    08/16/17 0346  WBC 10.1  HGB 5.9*  HCT 21.7*  PLT 382    COAGS: No results for input(s): INR, APTT in the last 8760 hours.  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 Corliss Skainseveshwar. Patient's condition improving- can spontaneously move all extremities but does not follow commands with left side. Plan for possible extubation today. Right groin incision stable. Appreciate and agree with neurology management. Plan to follow-up with Dr. Corliss Skainseveshwar in clinic 4 weeks after discharge.   Electronically Signed: Elwin MochaAlexandra Kiasha Bellin, PA-C 08/16/2017, 9:45 AM   I spent a total of 15 Minutes at the the patient's bedside AND on the patient's hospital floor or unit, greater than 50% of which was counseling/coordinating care for right MCA inferior division occlusion s/p revascularization.

## 2017-08-16 NOTE — Progress Notes (Signed)
PT Cancellation Note  Patient Details Name: Stacie Marshall MRN: [id number] DOB: [birth year]   Cancelled Treatment:      Reason Eval/Treat Not Completed: Medical issues which prohibited therapy;Patient not medically ready(Hb 5.9), BR orders,  intubated, sedated. Will continue to follow for appropriateness of therapy.     Fabio AsaDevon J Nizar Cutler 08/16/2017, 9:43 AM Charlotte Crumbevon Fermon Ureta, PT DPT  Board Certified Neurologic Specialist [phone]

## 2017-08-16 NOTE — Progress Notes (Signed)
  Echocardiogram 2D Echocardiogram has been performed.  Leta JunglingCooper, Nare Gaspari M 08/16/2017, 1:00 PM

## 2017-08-16 NOTE — Progress Notes (Signed)
OT Cancellation Note  Patient Details Name: Stacie Marshall MRN: [id number] DOB: Feb 06, 1975   Cancelled Treatment:    Reason Eval/Treat Not Completed: Medical issues which prohibited therapy;Patient not medically ready(Hb 5.9), BR orders,  intubated, sedated. Will continue to follow for appropriateness of therapy.  Evern BioLaura J Beaux Wedemeyer 08/16/2017, 9:30 AM  Sherryl MangesLaura Alexsandra Shontz OTR/L [phone]

## 2017-08-16 NOTE — Progress Notes (Signed)
VASCULAR LAB PRELIMINARY  PRELIMINARY  PRELIMINARY  PRELIMINARY  Carotid duplex completed.    Preliminary report:  1-39% ICA stenosis.  Vertebral artery flow is antegrade.   Stacie Marshall, RVT 08/16/2017, 5:59 PM

## 2017-08-16 NOTE — Anesthesia Postprocedure Evaluation (Signed)
Anesthesia Post Note  Patient: Stacie Marshall  Procedure(s) Performed: RADIOLOGY WITH ANESTHESIA (N/A )     Patient location during evaluation: ICU (4N bed 18C) Anesthesia Type: General Level of consciousness: sedated Pain management: pain level controlled Vital Signs Assessment: post-procedure vital signs reviewed and stable Respiratory status: patient remains intubated per anesthesia plan Cardiovascular status: stable Postop Assessment: no apparent nausea or vomiting Anesthetic complications: no    Last Vitals:  Vitals:   08/16/17 0600 08/16/17 0630  BP: 104/73 102/69  Pulse: 72 76  Resp: 20 20  Temp:    SpO2: 100% 100%    Last Pain:  Vitals:   08/16/17 0400  TempSrc: Axillary                 Kattaleya Alia P Ellee Wawrzyniak

## 2017-08-16 NOTE — Progress Notes (Signed)
VASCULAR LAB PRELIMINARY  PRELIMINARY  PRELIMINARY  PRELIMINARY  Bilateral lower extremity venous duplex completed.    Preliminary report:  There is no DVT or SVT noted in the bilateral lower extremities.   Lawanna Cecere, RVT 08/16/2017, 5:57 PM

## 2017-08-16 NOTE — Progress Notes (Signed)
SLP Cancellation Note  Patient Details Name: Debbrah AlarMaury Castillo Reynoso MRN: [id number] DOB: [birth year]   Cancelled treatment:       Reason Eval/Treat Not Completed: Patient at procedure or test/unavailable   Shant Hence, Riley NearingBonnie Caroline 08/16/2017, 4:16 PM

## 2017-08-16 NOTE — Procedures (Signed)
1000Extubation Procedure Note  Patient Details:   Name: Stacie Marshall DOB: 01/07/1976 MRN: [id number]   Airway Documentation:    Vent end date: 08/16/17 Vent end time: 1005   Evaluation  O2 sats: stable throughout Complications: No apparent complications Patient did tolerate procedure well. Bilateral Breath Sounds: Rhonchi, Diminished   Yes   Pt extubated to 3L [place] per MD order. Pt with no cuff leak prior to extubation, MD called to bedside and said OK to extubate anyways based on VTe. Slight upper airway wheeze post extubation. Pt with strong productive cough, encouraged to use Y[place]auer to clear secretions. Pt able to speak with no complications. ABG and VS within normal limits. RT will continue to monitor.   [name], Stacie Marshall 08/16/2017, 10:36 AM

## 2017-08-16 NOTE — Progress Notes (Signed)
PULMONARY / CRITICAL CARE MEDICINE   Name: Debbrah AlarMaury Castillo Reynoso MRN: [id number] DOB: August 18, 1975    ADMISSION DATE:  08/15/2017 CONSULTATION DATE:  08/15/2017  REFERRING MD:  Dr. Amada JupiterKirkpatrick   CHIEF COMPLAINT:  CVA  HISTORY OF PRESENT ILLNESS:   42 year old female with PMH of ETOH.  [facility] ED [facility] 7/[facility] with acute left side weakness. Given TPA. CTA with Right M2 Occlusion. Transferred to [facility] Th[facility]. PCCM asked to consult for vent management.     Significant event: 08/15/2017: Admission, TPA, agitation intubation, [facility], Thrombectomy 08/16/2017: SBT, hemoglobin dropped to 5.9 (hemoglobin at [facility])  Drips: Propofol  STUDIES:  MRI Brain 7/28 >> ECHO 7/28 >>  CULTURES: None.   ANTIBIOTICS: Ancef 7/28-Keflex  LINES/TUBES: ETT 7/27 >>  Right Arterial Sheath 7/27 >> removed 08/15/2017  Subjective: More awake and responsive today morning, follow simple commands, currently on pressure support since 8:00 in the morning - Complains of some lower abdominal pain denies pain at sheath site - No visible bleed -Baseline anemia per history   REVIEW OF SYSTEMS:   Unable to review as patient is intubated/sedated   SUBJECTIVE:   VITAL SIGNS: BP (!) 155/77   Pulse 79   Temp 99.3 F (37.4 C) (Axillary)   Resp (!) 24   Ht 5' (1.524 m)   Wt 193 lb 5.5 oz (87.7 kg)   SpO2 96%   BMI 37.76 kg/m   HEMODYNAMICS:    VENTILATOR SETTINGS: Vent Mode: PRVC FiO2 (%):  [30 %-40 %] 30 % Set Rate:  [14 bmp] 14 bmp Vt Set:  [360 mL] 360 mL PEEP:  [5 cmH20] 5 cmH20 Plateau Pressure:  [13 cmH20-19 cmH20] 13 cmH20  INTAKE / OUTPUT: I/O last 3 completed shifts: In: 3709.9 [I.V.:3709.9] Out: 3300 [Urine:3200; Blood:100]  PHYSICAL EXAMINATION: General:  Adult female, on vent  Neuro:  Sedated, does not follow commands HEENT:  ETT in place  Cardiovascular:  RRR, no MRG Lungs:  Clear breath sounds, no wheeze/crackles  Abdomen:  Obese,  active bowel sounds  Musculoskeletal:  -edema  Skin:  Warm, dry   LABS:  BMET Recent Labs  Lab 08/15/17 0728 08/16/17 0807  NA 139 138  K 3.6 3.2*  CL 112* 106  CO2 19* 22  BUN <5* <5*  CREATININE 0.45 0.39*  GLUCOSE 109* 114*    Electrolytes Recent Labs  Lab 08/15/17 0728 08/16/17 0807  CALCIUM 7.5* 8.1*    CBC Recent Labs  Lab 08/16/17 0346  WBC 10.1  HGB 5.9*  HCT 21.7*  PLT 382    Coag's No results for input(s): APTT, INR in the last 168 hours.  Sepsis Markers No results for input(s): LATICACIDVEN, PROCALCITON, O2SATVEN in the last 168 hours.  ABG Recent Labs  Lab 08/15/17 0636  PHART 7.325*  PCO2ART 35.4  PO2ART 92.0    Liver Enzymes Recent Labs  Lab 08/15/17 0728  AST 22  ALT 17  ALKPHOS 84  BILITOT 0.3  ALBUMIN 2.7*    Cardiac Enzymes No results for input(s): TROPONINI, PROBNP in the last 168 hours.  Glucose Recent Labs  Lab 08/15/17 0806 08/15/17 1626 08/15/17 2115 08/15/17 2310 08/16/17 0325 08/16/17 0756  GLUCAP 92 90 92 90 101* 107*    Imaging Mr Brain Wo Contrast  Result Date: 08/16/2017 CLINICAL DATA:  Acute onset LEFT-sided weakness. Follow up stroke. Status post endovascular revascularization of occluded RIGHT MCA. EXAM: MRI HEAD WITHOUT CONTRAST TECHNIQUE: Multiplanar, multiecho pulse sequences of the  brain and surrounding structures were obtained without intravenous contrast. COMPARISON:  CT HEAD August 07, 2017 FINDINGS: INTRACRANIAL CONTENTS: Confluent reduced diffusion RIGHT frontal lobes including insula and operculum with a few subcentimeter foci reduced diffusion RIGHT parietal lobe, all areas demonstrate low ADC values. Faint susceptibility artifact RIGHT frontal lobe and area of reduced diffusion. If no susceptibility artifact to suggest lobar hematoma. No midline shift or significant mass effect. No parenchymal brain volume loss for age. No hydrocephalus. No abnormal extra-axial fluid collections. VASCULAR:  Normal major intracranial vascular flow voids present at skull base. SKULL AND UPPER CERVICAL SPINE: No abnormal sellar expansion. No suspicious calvarial bone marrow signal. Craniocervical junction maintained. SINUSES/ORBITS: Mild paranasal sinus mucosal thickening with small maxillary sinus mucosal retention cyst. Bilateral mastoid effusions. Layering secretions in the pharynx. Included ocular globes and orbital contents are non-suspicious. OTHER: Life-support lines in place. IMPRESSION: Acute small to moderate RIGHT frontoparietal/MCA territory infarct with petechial hemorrhage. Electronically Signed   By: Awilda Metro M.D.   On: 08/16/2017 02:00       DISCUSSION: 42 year old with right M2 Occlusion s/p Thrombectomy.  Found to have a drop in hemoglobin  ASSESSMENT / PLAN:  Respiratory Insufficieny s/p CVA Plan  -Currently on SBT doing well-get ABG-if acceptable consider extubation -Trend ABG/CXR  -Pulmonary Hygiene  -VAP Bundle  HTN Plan  -Cardiac Monitoring -Maintain Systolic 120-140 (Use Neo/Cardene as needed) -ECHO done pending report  Right M2 Occlusion s/p Thrombectomy  Sedation Needs  H/O ETOH  Plan  -Per Neurology-continue aspirin per neurology -PT/OT/ST -Frequent Neuro Checks  -MRI Acute small to moderate RIGHT frontoparietal/MCA territory infarct with petechial hemorrhage. -Folic Acid/Thimine-history of EtOH monitor with CIWA protocol -PRN Fentanyl   Anemia: -Drop in hemoglobin 5.9 - No evidence of active bleeding - As patient had received TPA and had a right femoral sheath will consider abdominal CT to rule out any retroperitoneal bleed - Stool for occult blood - Received 2 units of PRBC -Trend hemoglobin keep it more than 7  Hypokalemia: Replaced  SUP Plan  -NPO -PPI    FAMILY  - Updates: Family updated at bedside.   - Inter-disciplinary family meet or Palliative Care meeting due by: 08/22/2017    Skin/Wound: Chronic change, Sheath site looks  clean  Electrolytes: Replace electrolytes per ICU electrolyte replacement protocol.   IVF: none  Nutrition: NPO for now  Prophylaxis: DVT Prophylaxis with SCD,. GI Prophylaxis.   Restraints: Soft limb  PT/OT eval and treat. OOB when appropriate.   Lines/Tubes:  08/14/17 foley  No central line. A line  ADVANCE DIRECTIVE:Full code  FAMILY DISCUSSION:Spoke with son  Quality Care: PPI, DVT prophylaxis, HOB elevated, Infection control all reviewed and addressed.  Events and notes from last 24 hours reviewed. Care plan discussed on multidisciplinary rounds  CC TIME:32 min    Old records reviewed discussed results and management plan with patient  Images personally reviewed and results and labs reviewed and discussed with patient.  All medication reviewed and adjusted  Further management depending on test results and work up as outlined above.    Roseanne Reno, M.D

## 2017-08-16 NOTE — Progress Notes (Signed)
STROKE TEAM PROGRESS NOTE   SUBJECTIVE (INTERVAL HISTORY) Patient son at bedside.  Patient still intubated, however awake alert, interactive with physician.  Son stated that she is on Keflex due to "kidney infection ".  She denies OCP use.  Carotid Doppler and LE venous Doppler negative.  Developed severe anemia with hemoglobin 5.9 status post PRBC transfusion.  Work-up showed severe iron deficiency, put on iron supplement.  Low in B12, on supplement.   OBJECTIVE Temp:  [98.4 F (36.9 C)-100.7 F (38.2 C)] 99 F (37.2 C) (07/29 0715) Pulse Rate:  [71-109] 81 (07/29 0730) Cardiac Rhythm: Normal sinus rhythm (07/29 0400) Resp:  [8-25] 18 (07/29 0730) BP: (99-128)/(58-87) 128/73 (07/29 0715) SpO2:  [98 %-100 %] 100 % (07/29 0730) Arterial Line BP: (101-139)/(58-91) 139/82 (07/29 0730) FiO2 (%):  [40 %] 40 % (07/29 0700) Weight:  [193 lb 5.5 oz (87.7 kg)] 193 lb 5.5 oz (87.7 kg) (07/29 0421)  CBC:  Recent Labs  Lab 08/16/17 0346  WBC 10.1  NEUTROABS 7.0  HGB 5.9*  HCT 21.7*  MCV 61.6*  PLT 382    Basic Metabolic Panel:  Recent Labs  Lab 08/15/17 0728  NA 139  K 3.6  CL 112*  CO2 19*  GLUCOSE 109*  BUN <5*  CREATININE 0.45  CALCIUM 7.5*    Lipid Panel:     Component Value Date/Time   CHOL 127 08/15/2017 0729   TRIG 208 (H) 08/15/2017 0729   TRIG 200 (H) 08/15/2017 0729   HDL 23 (L) 08/15/2017 0729   CHOLHDL 5.5 08/15/2017 0729   VLDL 42 (H) 08/15/2017 0729   LDLCALC 62 08/15/2017 0729   HgbA1c:  Lab Results  Component Value Date   HGBA1C 5.8 (H) 08/15/2017   Urine Drug Screen:     Component Value Date/Time   LABOPIA NONE DETECTED 08/15/2017 1425   COCAINSCRNUR NONE DETECTED 08/15/2017 1425   LABBENZ POSITIVE (A) 08/15/2017 1425   AMPHETMU NONE DETECTED 08/15/2017 1425   THCU NONE DETECTED 08/15/2017 1425   LABBARB NONE DETECTED 08/15/2017 1425    Alcohol Level [facility] results found for: [facility]  MRI Brain WO Contrast   08/16/2017 IMPRESSION: Acute small to moderate RIGHT frontoparietal/MCA territory infarct with petechial hemorrhage.   CT Abdomen and Pelvis with Contrast 08/16/2017 IMPRESSION: 1. No retroperitoneal hematoma. 2. Small pleural effusions with associated atelectasis. 3. Unchanged mild hypoattenuation of the interpolar right kidney. Correlate with urinalysis.  Transthoracic Echocardiogram 08/16/2017 Study Conclusions  - Left ventricle: The cavity size was normal. Systolic function was   normal. The estimated ejection fraction was in the range of 60%   to 65%. Wall motion was normal; there were no regional wall   motion abnormalities. Left ventricular diastolic function   parameters were normal.  IR Thrombectomy 08/15/2017 S/P rt common carotid arteriogram followed by complete revascularization of occluded Rt MCA dominant inf division with x 1 pass with embotrap 5 mmm x 33mm device achieving a TICI 3 reperfusion  LE Venous Dopplers - no DVT  CUS - Bilateral: 1-39% ICA stenosis. Vertebral artery flow is antegrade.   TEE pending    PHYSICAL EXAM Vitals:   08/16/17 0630 08/16/17 0700 08/16/17 0715 08/16/17 0730  BP: 102/69 121/74 128/73   Pulse: 76 83 74 81  Resp: 20 17 (!) 21 18  Temp:  99.2 F (37.3 C) 99 F (37.2 C)   TempSrc:  Axillary Axillary   SpO2: 100% 100% 100% 100%  Weight:      Height:  Temp:  [99 F (37.2 C)-100.9 F (38.3 C)] 100.3 F (37.9 C) (07/29 1600) Pulse Rate:  [71-98] 89 (07/29 2000) Resp:  [8-25] 18 (07/29 2000) BP: (101-172)/(58-113) 128/76 (07/29 2000) SpO2:  [94 %-100 %] 97 % (07/29 2000) Arterial Line BP: (101-154)/(58-91) 137/82 (07/29 1500) FiO2 (%):  [30 %-40 %] 30 % (07/29 0800) Weight:  [193 lb 5.5 oz (87.7 kg)] 193 lb 5.5 oz (87.7 kg) (07/29 0421)  General - Well nourished, well developed, intubated not on sedation.  Ophthalmologic - fundi not visualized due to noncooperation.  Cardiovascular - Regular rate and  rhythm  Neuro -intubated not on sedation, eyes open, following commands.  Able to answer questions with shakes or nod head appropriately.  Mild right gaze preference, but able to cross midline.  Left simultanagnosia on visual confrontation.  Facial symmetry difficult to evaluate due to ET tube.  Tongue midline.  Left upper extremity 3+/5.  Right upper extremity 5/5.  Bilateral lower extremity 4/5.  DTR 2 +, no Babinski.  Sensory symmetrical.  Coordination not cooperative, gait not tested.   ASSESSMENT/PLAN [name] is a 42 y.o. female with no significant past medical history presenting with left sided weakness. She received  IV TPA per tele-neurology at [facility]. She underwent thrombectomy Rt MCA dominant inf division.  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  Resultant Left arm weakness  MRI head - Acute small to moderate RIGHT frontoparietal/MCA territory infarct with petechial hemorrhage.  CTA head and neck right M2 occlusion  IR right M2 occlusion status post TICI 3 reperfusion  LE venous Doppler negative for DVT  2D Echo - EF 60 - 65%. No cardiac source of emboli identified.  Carotid Doppler -unremarkable  TEE pending  Hypercoagulable work-up pending  LDL - 62  HgbA1c - 5.8  VTE prophylaxis - SCDs  No antithrombotic prior to admission, now on aspirin 325mg   Patient counseled to be compliant with her antithrombotic medications  Ongoing aggressive stroke risk factor management  Therapy recommendations:  pending  Disposition:  Pending  Iron deficiency anemia  CT showed no retroperitoneal hematoma  Hb 8.3->5.9->PRBC->8.3  Low iron and ferritin  On iron supplement  Close monitoring CBC  Alcoholism  6-10 beers per day  On FA/MVI/B1  CIWA protocol  Tobacco abuse  Current smoker  Smoking cessation counseling provided  Pt is willing to  quit  Kidney versus bladder infection  Takes Keflex as outpatient for the last 3 days  Continue Keflex  UA pending  CT Abdomen and Pelvis Unchanged mild hypoattenuation of the interpolar right kidney. Correlate with urinalysis.  B12 deficiency  B12 156  B12 supplement daily with IM injection  Change to p.o. once to access  Hyperlipidemia  Lipid lowering medication PTA:  none  LDL 62, goal < 70  Current lipid lowering medication: Low-dose Lipitor  Continue statin at discharge  Other Stroke Risk Factors  Obesity, Body mass index is 37.76 kg/m., recommend weight loss, diet and exercise as appropriate   Other Active Problems  Likely extubation today   Hospital day # 1  This patient is critically ill due to right MCA stroke, alcoholism, severe anemia, B12 deficiency, kidney infection and at significant risk of neurological worsening, death form recurrent stroke, hemorrhagic conversion, cerebral edema, anemia, DT. This patient's care requires constant monitoring of vital signs, hemodynamics, respiratory and cardiac monitoring, review of multiple databases, neurological assessment, discussion with family, other  specialists and medical decision making of high complexity. I spent 45 minutes of neurocritical care time in the care of this patient.  I also discussed with Dr. Sherryll Burger from CCM in the hallway.  Marvel Plan, MD PhD Stroke Neurology 08/16/2017 8:22 PM  To contact Stroke Continuity provider, please refer to [email or web address]. After hours, contact General Neurology

## 2017-08-16 NOTE — Progress Notes (Signed)
eLink Physician-Brief Progress Note Patient Name: Debbrah AlarMaury Castillo Reynoso DOB: [birth year] MRN: [id number]   Date of Service  08/16/2017  HPI/Events of Note  Anemia - Hgb = 5.9.  eICU Interventions  Will transfuse 2 units PRBC.     Intervention Category Major Interventions: Other:  Jadasia Haws Dennard Nipugene 08/16/2017, 5:14 AM

## 2017-08-17 ENCOUNTER — Inpatient Hospital Stay (HOSPITAL_COMMUNITY): Payer: Medicaid Other

## 2017-08-17 ENCOUNTER — Other Ambulatory Visit: Payer: Self-pay

## 2017-08-17 ENCOUNTER — Encounter (HOSPITAL_COMMUNITY): Payer: Self-pay | Admitting: Interventional Radiology

## 2017-08-17 DIAGNOSIS — F1010 Alcohol abuse, uncomplicated: Secondary | ICD-10-CM | POA: Diagnosis present

## 2017-08-17 DIAGNOSIS — F17200 Nicotine dependence, unspecified, uncomplicated: Secondary | ICD-10-CM | POA: Diagnosis present

## 2017-08-17 DIAGNOSIS — E669 Obesity, unspecified: Secondary | ICD-10-CM | POA: Diagnosis present

## 2017-08-17 DIAGNOSIS — I6310 Cerebral infarction due to embolism of unspecified precerebral artery: Secondary | ICD-10-CM

## 2017-08-17 DIAGNOSIS — I63031 Cerebral infarction due to thrombosis of right carotid artery: Secondary | ICD-10-CM

## 2017-08-17 DIAGNOSIS — I6601 Occlusion and stenosis of right middle cerebral artery: Secondary | ICD-10-CM

## 2017-08-17 LAB — COMPREHENSIVE METABOLIC PANEL
ALBUMIN: 2.8 g/dL — AB (ref 3.5–5.0)
ALK PHOS: 83 U/L (ref 38–126)
ALT: 14 U/L (ref 0–44)
ANION GAP: 7 (ref 5–15)
AST: 23 U/L (ref 15–41)
BILIRUBIN TOTAL: 0.8 mg/dL (ref 0.3–1.2)
CALCIUM: 8.4 mg/dL — AB (ref 8.9–10.3)
CO2: 22 mmol/L (ref 22–32)
CREATININE: 0.48 mg/dL (ref 0.44–1.00)
Chloride: 109 mmol/L (ref 98–111)
GFR calc Af Amer: >60 mL/min (ref >60)
GFR calc non Af Amer: >60 mL/min (ref >60)
GLUCOSE: 89 mg/dL (ref 70–99)
Potassium: 3.2 mmol/L — ABNORMAL LOW (ref 3.5–5.1)
Sodium: 138 mmol/L (ref 135–145)
TOTAL PROTEIN: 6.4 g/dL — AB (ref 6.5–8.1)

## 2017-08-17 LAB — LUPUS ANTICOAGULANT PANEL
DRVVT: 31 s (ref 0.0–47.0)
PTT LA: 35.1 s (ref 0.0–51.9)

## 2017-08-17 LAB — BPAM RBC
BLOOD PRODUCT EXPIRATION DATE: 201908212359
BLOOD PRODUCT EXPIRATION DATE: 201908212359
ISSUE DATE / TIME: 201907290926
ISSUE DATE / TIME: 201907290651
UNIT TYPE AND RH: 7300
Unit Type and Rh: 7300

## 2017-08-17 LAB — TYPE AND SCREEN
ABO/RH(D): B POS
ANTIBODY SCREEN: NEGATIVE
UNIT DIVISION: 0
Unit division: 0

## 2017-08-17 LAB — URINALYSIS, COMPLETE (UACMP) WITH MICROSCOPIC
Bilirubin Urine: NEGATIVE
Glucose, UA: NEGATIVE mg/dL
Hgb urine dipstick: NEGATIVE
Ketones, ur: 5 mg/dL — AB
Nitrite: NEGATIVE
PROTEIN: NEGATIVE mg/dL
Specific Gravity, Urine: 1.012 (ref 1.005–1.030)
pH: 5 (ref 5.0–8.0)

## 2017-08-17 LAB — CBC WITH DIFFERENTIAL/PLATELET
BASOS ABS: 0 10*3/uL (ref 0.0–0.1)
BASOS PCT: 0 %
Eosinophils Absolute: 0.1 10*3/uL (ref 0.0–0.7)
Eosinophils Relative: 1 %
HEMATOCRIT: 28.1 % — AB (ref 36.0–46.0)
HEMOGLOBIN: 8.4 g/dL — AB (ref 12.0–15.0)
LYMPHS PCT: 19 %
Lymphs Abs: 2.1 10*3/uL (ref 0.7–4.0)
MCH: 19.7 pg — ABNORMAL LOW (ref 26.0–34.0)
MCHC: 29.9 g/dL — AB (ref 30.0–36.0)
MCV: 65.8 fL — ABNORMAL LOW (ref 78.0–100.0)
MONOS PCT: 8 %
Monocytes Absolute: 0.9 10*3/uL (ref 0.1–1.0)
Neutro Abs: 8.2 10*3/uL — ABNORMAL HIGH (ref 1.7–7.7)
Neutrophils Relative %: 72 %
Platelets: 302 10*3/uL (ref 150–400)
RBC: 4.27 MIL/uL (ref 3.87–5.11)
RDW: 26.5 % — ABNORMAL HIGH (ref 11.5–15.5)
WBC: 11.3 10*3/uL — ABNORMAL HIGH (ref 4.0–10.5)

## 2017-08-17 LAB — GLUCOSE, CAPILLARY
Glucose-Capillary: 89 mg/dL (ref 70–99)
Glucose-Capillary: 80 mg/dL (ref 70–99)
Glucose-Capillary: 83 mg/dL (ref 70–99)

## 2017-08-17 LAB — PROTIME-INR
INR: 1.21
Prothrombin Time: 15.2 seconds (ref 11.4–15.2)

## 2017-08-17 LAB — PROTEIN C, TOTAL: PROTEIN C, TOTAL: 65 % (ref 60–150)

## 2017-08-17 LAB — HEMOGLOBIN AND HEMATOCRIT, BLOOD
HCT: 28.1 % — ABNORMAL LOW (ref 36.0–46.0)
HEMATOCRIT: 29.2 % — AB (ref 36.0–46.0)
HEMOGLOBIN: 8.7 g/dL — AB (ref 12.0–15.0)
Hemoglobin: 8.3 g/dL — ABNORMAL LOW (ref 12.0–15.0)

## 2017-08-17 LAB — APTT: APTT: 31 s (ref 24–36)

## 2017-08-17 LAB — PROTEIN S ACTIVITY: Protein S Activity: 68 % (ref 63–140)

## 2017-08-17 LAB — CARDIOLIPIN ANTIBODIES, IGG, IGM, IGA
Anticardiolipin IgA: <9 APL U/mL (ref 0–11)
Anticardiolipin IgG: <9 GPL U/mL (ref 0–14)
Anticardiolipin IgM: <9 MPL U/mL (ref 0–12)

## 2017-08-17 LAB — HOMOCYSTEINE: Homocysteine: 9.5 umol/L (ref 0.0–15.0)

## 2017-08-17 LAB — ANTI-DNA ANTIBODY, DOUBLE-STRANDED: DS DNA AB: 1 [IU]/mL (ref 0–9)

## 2017-08-17 LAB — PROTEIN C ACTIVITY: PROTEIN C ACTIVITY: 78 % (ref 73–180)

## 2017-08-17 LAB — MAGNESIUM: Magnesium: 2 mg/dL (ref 1.7–2.4)

## 2017-08-17 LAB — PROTEIN S, TOTAL: PROTEIN S AG TOTAL: 73 % (ref 60–150)

## 2017-08-17 MED ORDER — FERROUS SULFATE 325 (65 FE) MG PO TABS
325.0000 mg | ORAL_TABLET | Freq: Three times a day (TID) | ORAL | Status: DC
  Administered 2017-08-17 – 2017-08-19 (×3): 325 mg via ORAL
  Filled 2017-08-17 (×4): qty 1

## 2017-08-17 MED ORDER — ENSURE ENLIVE PO LIQD: 237.0000 mL | Freq: Two times a day (BID) | ORAL | Status: DC

## 2017-08-17 MED ORDER — POTASSIUM CHLORIDE CRYS ER 20 MEQ PO TBCR
40.0000 meq | EXTENDED_RELEASE_TABLET | Freq: Once | ORAL | Status: AC
  Administered 2017-08-17: 40 meq via ORAL
  Filled 2017-08-17: qty 2

## 2017-08-17 MED ORDER — ASPIRIN 325 MG PO TABS
325.0000 mg | ORAL_TABLET | Freq: Every day | ORAL | Status: DC
  Administered 2017-08-17 – 2017-08-18 (×2): 325 mg via ORAL
  Filled 2017-08-17 (×2): qty 1

## 2017-08-17 MED ORDER — VITAMIN B-1 100 MG PO TABS
100.0000 mg | ORAL_TABLET | Freq: Every day | ORAL | Status: DC
  Administered 2017-08-17: 100 mg via ORAL
  Filled 2017-08-17 (×2): qty 1

## 2017-08-17 MED ORDER — IBUPROFEN 200 MG PO TABS
400.0000 mg | ORAL_TABLET | Freq: Once | ORAL | Status: AC
  Administered 2017-08-17: 400 mg via ORAL
  Filled 2017-08-17: qty 2

## 2017-08-17 MED ORDER — ATORVASTATIN CALCIUM 10 MG PO TABS
10.0000 mg | ORAL_TABLET | Freq: Every day | ORAL | Status: DC
  Administered 2017-08-17 – 2017-08-19 (×3): 10 mg via ORAL
  Filled 2017-08-17 (×3): qty 1

## 2017-08-17 MED ORDER — FERROUS SULFATE 300 (60 FE) MG/5ML PO SYRP
300.0000 mg | ORAL_SOLUTION | Freq: Three times a day (TID) | ORAL | Status: DC
  Filled 2017-08-17: qty 5

## 2017-08-17 MED ORDER — VITAMIN B-12 1000 MCG PO TABS
1000.0000 ug | ORAL_TABLET | Freq: Every day | ORAL | Status: DC
  Administered 2017-08-17: 1000 ug via ORAL
  Filled 2017-08-17 (×2): qty 1

## 2017-08-17 MED ORDER — THIAMINE HCL 100 MG/ML IJ SOLN: 100.0000 mg | Freq: Every day | INTRAMUSCULAR | Status: DC

## 2017-08-17 MED ORDER — CEPHALEXIN 250 MG/5ML PO SUSR
500.0000 mg | Freq: Two times a day (BID) | ORAL | Status: DC
  Filled 2017-08-17: qty 10

## 2017-08-17 MED ORDER — ADULT MULTIVITAMIN W/MINERALS CH
1.0000 | ORAL_TABLET | Freq: Every day | ORAL | Status: DC
  Administered 2017-08-17: 1 via ORAL
  Filled 2017-08-17 (×2): qty 1

## 2017-08-17 MED ORDER — FOLIC ACID 1 MG PO TABS
1.0000 mg | ORAL_TABLET | Freq: Every day | ORAL | Status: DC
  Administered 2017-08-17: 1 mg via ORAL
  Filled 2017-08-17 (×2): qty 1

## 2017-08-17 MED ORDER — CEPHALEXIN 500 MG PO CAPS
500.0000 mg | ORAL_CAPSULE | Freq: Two times a day (BID) | ORAL | Status: DC
  Administered 2017-08-17 – 2017-08-18 (×3): 500 mg via ORAL
  Filled 2017-08-17 (×4): qty 1

## 2017-08-17 NOTE — Evaluation (Addendum)
Occupational Therapy Evaluation Patient Details Name: Stacie Marshall MRN: [id number] DOB: 1975-08-05 Today's Date: 08/17/2017    History of Present Illness 42 year old female with PMH of ETOH. Presents to [facility] ED on 7/27 with acute left side weakness. Given TPA. CTA with Right M2 Occlusion. Transferred to [facility] for Thrombectomy completed on 7/28. Pt intubated from 7/27-7/29 in am. 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    Clinical Impression   This 42 y/o female presents with the above. At baseline pt is independent with ADLs, iADLs and functional mobility, was driving and working. Pt demonstrated functional mobility in room and hallway without AD and overall minguard-minA this session. Currently requires minA for LB, toileting and standing grooming ADLs, minguard assist for seated UB ADLs. Pt presenting with LUE fine motor/coordination deficits. Pt reports she does not have transportation for outpatient OT services at time of discharge, recommend followup HHOT services to maximize her overall safety and independence with ADLs and mobility prior to discharge home. Will continue to follow acutely.     Follow Up Recommendations  Home health OT;Supervision/Assistance - 24 hour    Equipment Recommendations  None recommended by OT           Precautions / Restrictions Precautions Precautions: Fall Restrictions Weight Bearing Restrictions: No      Mobility Bed Mobility Overal bed mobility: Needs Assistance Bed Mobility: Supine to Sit     Supine to sit: Min guard     General bed mobility comments: min guard for safety, but no physical assist required.  Transfers Overall transfer level: Needs assistance   Transfers: Sit to/from Stand Sit to Stand: Min guard;Min assist         General transfer comment: min assist for stability in rising to upright from bed and toilet    Balance Overall balance  assessment: Needs assistance Sitting-balance support: Feet supported Sitting balance-Leahy Scale: Good       Standing balance-Leahy Scale: Fair Standing balance comment: able to static stand, some increased sway and reliance on UE support intermittently             High level balance activites: Direction changes;Head turns;Turns High Level Balance Comments: modest instability with higer level tasks           ADL either performed or assessed with clinical judgement   ADL Overall ADL's : Needs assistance/impaired Eating/Feeding: Set up;Sitting   Grooming: Min guard;Minimal assistance;Standing Grooming Details (indicate cue type and reason): increased time for use of LUE  Upper Body Bathing: Min guard;Sitting   Lower Body Bathing: Minimal assistance;Sit to/from stand   Upper Body Dressing : Min guard;Sitting   Lower Body Dressing: Minimal assistance;Sit to/from stand Lower Body Dressing Details (indicate cue type and reason): pt donning L sock with increased time using figure 4 technique seated EOB  Toilet Transfer: Minimal assistance;Ambulation;Regular Toilet;Grab bars   Toileting- Clothing Manipulation and Hygiene: Minimal assistance;Sit to/from stand Toileting - Clothing Manipulation Details (indicate cue type and reason): min steadying assist while pt performs gown and brief management      Functional mobility during ADLs: Minimal assistance       Vision Baseline Vision/History: No visual deficits Patient Visual Report: No change from baseline(reports initial double vision, but has since subsided ) Vision Assessment?: Yes Eye Alignment: Within Functional Limits Ocular Range of Motion: Within Functional Limits Tracking/Visual Pursuits: Decreased smoothness of horizontal tracking Visual Fields: No apparent deficits     Perception  Praxis      Pertinent Vitals/Pain Pain Assessment: Faces Faces Pain Scale: Hurts little more Pain Location: headache Pain  Descriptors / Indicators: Headache Pain Intervention(s): Monitored during session     Hand Dominance Left   Extremity/Trunk Assessment Upper Extremity Assessment Upper Extremity Assessment: LUE deficits/detail LUE Deficits / Details: noted LUE weakness, edemous  LUE Coordination: decreased fine motor;decreased gross motor   Lower Extremity Assessment Lower Extremity Assessment: Defer to PT evaluation LLE Deficits / Details: modest assymetry but overall within functional limits       Communication Communication Communication: No difficulties   Cognition Arousal/Alertness: Awake/alert Behavior During Therapy: WFL for tasks assessed/performed Overall Cognitive Status: Within Functional Limits for tasks assessed                                                      Home Living Family/patient expects to be discharged to:: Private residence Living Arrangements: Spouse/significant other;Children Available Help at Discharge: Family Type of Home: Mobile home Home Access: Stairs to enter Secretary/administratorntrance Stairs-Number of Steps: 7 Entrance Stairs-Rails: Left Home Layout: One level     Bathroom Shower/Tub: Chief Strategy OfficerTub/shower unit   Bathroom Toilet: Standard     Home Equipment: None          Prior Functioning/Environment Level of Independence: Independent                 OT Problem List: Decreased strength;Impaired balance (sitting and/or standing);Decreased activity tolerance;Impaired UE functional use      OT Treatment/Interventions: Self-care/ADL training;DME and/or AE instruction;Therapeutic activities;Balance training;Therapeutic exercise;Patient/family education    OT Goals(Current goals can be found in the care plan section) Acute Rehab OT Goals Patient Stated Goal: to go home  OT Goal Formulation: With patient Time For Goal Achievement: 08/31/17 Potential to Achieve Goals: Good  OT Frequency: Min 3X/week   Barriers to D/C:             Co-evaluation PT/OT/SLP Co-Evaluation/Treatment: Yes Reason for Co-Treatment: For patient/therapist safety;To address functional/ADL transfers PT goals addressed during session: Mobility/safety with mobility OT goals addressed during session: ADL's and self-care      AM-PAC PT "6 Clicks" Daily Activity     Outcome Measure Help from another person eating meals?: None Help from another person taking care of personal grooming?: None Help from another person toileting, which includes using toliet, bedpan, or urinal?: A Little Help from another person bathing (including washing, rinsing, drying)?: A Little Help from another person to put on and taking off regular upper body clothing?: None Help from another person to put on and taking off regular lower body clothing?: A Little 6 Click Score: 21   End of Session Equipment Utilized During Treatment: Gait belt Nurse Communication: Mobility status  Activity Tolerance: Patient tolerated treatment well Patient left: in chair;with call bell/phone within reach;with chair alarm set;with family/visitor present  OT Visit Diagnosis: Muscle weakness (generalized) (M62.81);Other symptoms and signs involving the nervous system (R29.898)                Time: [id number] OT Time Calculation (min): 26 min Charges:  OT General Charges $OT Visit: 1 Visit OT Evaluation $OT Eval Moderate Complexity: 1 Mod  Marcy SirenBreanna Glenmore Karl, [place]OT Pager [phone] 08/17/2017   Orlando PennerBreanna L Kavian Peters 08/17/2017, 1:27 PM

## 2017-08-17 NOTE — Progress Notes (Addendum)
STROKE TEAM PROGRESS NOTE   SUBJECTIVE (INTERVAL HISTORY) Patient son at bedside.  Patient now extubated.  Alert and oriented, stable.  TEE scheduled for Friday as patient needs MAC given history of alcohol use.     OBJECTIVE Temp:  [98.7 F (37.1 C)-100.9 F (38.3 C)] 98.7 F (37.1 C) (07/30 0800) Pulse Rate:  [65-91] 65 (07/30 1000) Cardiac Rhythm: Normal sinus rhythm (07/30 0800) Resp:  [16-25] 20 (07/30 1000) BP: (106-171)/(63-92) 118/75 (07/30 0900) SpO2:  [94 %-100 %] 96 % (07/30 1000) Arterial Line BP: (136-154)/(79-91) 137/82 (07/29 1500) Weight:  [87.7 kg (193 lb 5.5 oz)] 87.7 kg (193 lb 5.5 oz) (07/30 0500)  CBC:  Recent Labs  Lab 08/16/17 0346  08/17/17 0144 08/17/17 0847  WBC 10.1  --  11.3*  --   NEUTROABS 7.0  --  8.2*  --   HGB 5.9*   < > 8.4* 8.3*  HCT 21.7*   < > 28.1* 28.1*  MCV 61.6*  --  65.8*  --   PLT 382  --  302  --    < > = values in this interval not displayed.    Basic Metabolic Panel:  Recent Labs  Lab 08/16/17 0807 08/16/17 1449 08/17/17 0144  NA 138  --  138  K 3.2*  --  3.2*  CL 106  --  109  CO2 22  --  22  GLUCOSE 114*  --  89  BUN <5*  --  <5*  CREATININE 0.39*  --  0.48  CALCIUM 8.1*  --  8.4*  MG  --  2.0 2.0    Lipid Panel:     Component Value Date/Time   CHOL 127 08/15/2017 0729   TRIG 208 (H) 08/15/2017 0729   TRIG 200 (H) 08/15/2017 0729   HDL 23 (L) 08/15/2017 0729   CHOLHDL 5.5 08/15/2017 0729   VLDL 42 (H) 08/15/2017 0729   LDLCALC 62 08/15/2017 0729   HgbA1c:  Lab Results  Component Value Date   HGBA1C 5.8 (H) 08/15/2017   Urine Drug Screen:     Component Value Date/Time   LABOPIA NONE DETECTED 08/15/2017 1425   COCAINSCRNUR NONE DETECTED 08/15/2017 1425   LABBENZ POSITIVE (A) 08/15/2017 1425   AMPHETMU NONE DETECTED 08/15/2017 1425   THCU NONE DETECTED 08/15/2017 1425   LABBARB NONE DETECTED 08/15/2017 1425     IMAGING  MRI Brain WO Contrast  08/16/2017 IMPRESSION: Acute small to moderate  RIGHT frontoparietal/MCA territory infarct with petechial hemorrhage.   CT Abdomen and Pelvis with Contrast 08/16/2017 IMPRESSION: 1. No retroperitoneal hematoma. 2. Small pleural effusions with associated atelectasis. 3. Unchanged mild hypoattenuation of the interpolar right kidney. Correlate with urinalysis.  Transthoracic Echocardiogram 08/16/2017 Study Conclusions  - Left ventricle: The cavity size was normal. Systolic function was   normal. The estimated ejection fraction was in the range of 60%   to 65%. Wall motion was normal; there were no regional wall   motion abnormalities. Left ventricular diastolic function   parameters were normal.  IR Thrombectomy 08/15/2017 S/P rt common carotid arteriogram followed by complete revascularization of occluded Rt MCA dominant inf division with x 1 pass with embotrap 5 mmm x 21m device achieving a TICI 3 reperfusion  LE Venous Dopplers - no DVT  CUS - Bilateral: 1-39% ICA stenosis. Vertebral artery flow is antegrade.   TEE pending    PHYSICAL EXAM Vitals:   08/17/17 0700 08/17/17 0800 08/17/17 0900 08/17/17 1000  BP: 114/63 111/77 118/75  Pulse: 73 70 75 65  Resp: (!) _0 Temp:  98.7 F (37.1 C)    TempSrc:  Oral    SpO2: 97% 98% 97% 96%  Weight:      Height:        Temp:  [98.7 F (37.1 C)-100.9 F (38.3 C)] 98.7 F (37.1 C) (07/30 0800) Pulse Rate:  [65-91] 65 (07/30 1000) Resp:  [16-25] 20 (07/30 1000) BP: (106-171)/(63-92) 118/75 (07/30 0900) SpO2:  [94 %-100 %] 96 % (07/30 1000) Arterial Line BP: (136-154)/(79-91) 137/82 (07/29 1500) Weight:  [87.7 kg (193 lb 5.5 oz)] 87.7 kg (193 lb 5.5 oz) (07/30 0500)  General - Well nourished, well developed, intubated not on sedation.  Ophthalmologic - fundi not visualized due to noncooperation.  Cardiovascular - Regular rate and rhythm  Neuro -intubated not on sedation, eyes open, following commands.  Able to answer questions with shakes or nod head  appropriately.  Mild right gaze preference, but able to cross midline.  Left simultanagnosia on visual confrontation.  Facial symmetry difficult to evaluate due to ET tube.  Tongue midline.  Left upper extremity 3+/5.  Right upper extremity 5/5.  Bilateral lower extremity 4/5.  DTR 2 +, no Babinski.  Sensory symmetrical.  Coordination not cooperative, gait not tested.   ASSESSMENT/PLAN [name] is a 42 y.o. female with no significant past medical history presenting with left sided weakness. She received  IV TPA per tele-neurology at [facility]. She underwent thrombectomy Rt MCA dominant inf division.  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  Resultant Left arm weakness  MRI head - Acute small to moderate RIGHT frontoparietal/MCA territory infarct with petechial hemorrhage.  CTA head and neck right M2 occlusion  IR right M2 occlusion status post TICI 3 reperfusion  LE venous Doppler negative for DVT  2D Echo - EF 60 - 65%. No cardiac source of emboli identified.  Carotid Doppler -unremarkable  TEE pending Friday with anesthesia given ETOH hx  Hypercoagulable work-up ESR 25, CRP 5.7, other test normal or pending   LDL - 62  HgbA1c - 5.8  VTE prophylaxis - SCDs  No antithrombotic prior to admission, now on aspirin 35m  Patient counseled to be compliant with her antithrombotic medications  Ongoing aggressive stroke risk factor management  Therapy recommendations:  HH OT, no PT needs. Family supportive  Disposition:  Pending  Transfer to the floor  Acute Blood loss Anemia  Hx Iron deficiency anemia  CT showed no retroperitoneal hematoma  Hb 8.3->5.9->PRBC->8.3  Low iron and ferritin  On iron supplement  Close monitoring CBC  Alcoholism  6-10 beers per day  On FA/MVI/B1  CIWA protocol  Tobacco abuse  Current smoker  Smoking cessation counseling  provided  Pt is willing to quit  Kidney versus bladder infection  Takes Keflex as outpatient for the last 3 days  Continue Keflex  UA mod leukocytes, many bacteria, WBC 6-10  CT Abdomen and Pelvis Unchanged mild hypoattenuation of the interpolar right kidney. Correlate with urinalysis.  B12 deficiency  B12 156  B12 supplement daily with IM injection  Change to p.o. once to access  Hyperlipidemia  Lipid lowering medication PTA:  none  LDL 62, goal < 70  Current lipid lowering medication:  Lipitor 10  Continue statin at discharge  Other Stroke Risk Factors  Obesity, Body mass index is 37.76 kg/m., recommend weight loss, diet and exercise  as appropriate   ETOH abuse  UDS positive for benzos. ETOH not performed  Other Active Problems  Respiratory failure d/t stroke, resolved  Hypokalemia, replaced, 3.2. Recheck in am  Hospital day # 2  Burnetta Sabin, MSN, APRN, ANVP-BC, AGPCNP-BC Advanced Practice Stroke Nurse [place] for Schedule & Pager information 08/17/2017 4:38 PM  To contact Stroke Continuity provider, please refer to [email or web address] After hours, contact General Neurology  ATTENDING NOTE: I reviewed above note and agree with the assessment and plan. I have made any additions or clarifications directly to the above note. Pt was seen and examined.   Patient was extubated yesterday.  No acute event overnight.  Sitting in bed still has left arm weakness but able to walk with PT. Stroke work up so far unrevealing. Pt admitted to smoke 0.5 PPD and drink 8-10 beers on the weekends. Denies OCP use, no family hx of clotting disorder, no HA hx, no cognitive impairment. Pending TEE at this time to rule out cardiac source of emboli. PT/OT recommend home OT. Transfer to floor.   Rosalin Hawking, MD PhD Stroke Neurology 08/17/2017 4:50 PM

## 2017-08-17 NOTE — Progress Notes (Signed)
Referring Physician(s): CODE STROKE  Supervising Physician: Julieanne Cotton  Patient Status:  [facility] - In-pt  Chief Complaint: Left arm weakness  Subjective:  Right MCA inferior division occlusion s/p revascularization 08/15/2017 with Dr. Corliss Skains. Patient awake and alert laying in bed. Accompanied by family member at bedside. Can spontaneously move all extremities and follow commands with left side, however 3/5 strength of left arm- she is able to abduct shoulder to 90 degrees but point to point discrimination slower on left side. Right groin incision c/d/i.   Allergies: Patient has no known allergies.  Medications: Prior to Admission medications   Medication Sig Start Date End Date Taking? Authorizing Provider  cephALEXin (KEFLEX) 500 MG capsule Take 500 mg by mouth every 8 (eight) hours.   Yes [provider]     Vital Signs: BP 118/75   Pulse 65   Temp 98.7 F (37.1 C) (Oral)   Resp 20   Ht 5' (1.524 m)   Wt 193 lb 5.5 oz (87.7 kg)   SpO2 96%   BMI 37.76 kg/m   Physical Exam  Constitutional: She appears well-developed and well-nourished. No distress.  Cardiovascular: Normal rate, regular rhythm and normal heart sounds.  No murmur heard. Pulmonary/Chest: Effort normal and breath sounds normal. No respiratory distress. She has no wheezes.  Neurological:  Alert, awake, and oriented x3. Speech and comprehension intact. PERRL bilaterally. EOMs intact bilaterally without nystagmus or subjective diplopia. Visual fields not assessed. No facial asymmetry. Tongue midline. Can spontaneously move all extremities and follow commands with left side, however 3/5 strength of left arm- she is able to abduct shoulder to 90 degrees but point to point discrimination slower on left side. Pronator drift not assessed. Fine motor and coordination intact but slower on left. Gait not assessed. Romberg not assessed. Heel to toe not assessed. Distal pulses 2+  bilaterally.  Skin: Skin is warm and dry.  Right groin incision soft without active bleeding or hematoma.  Psychiatric: She has a normal mood and affect. Her behavior is normal. Judgment and thought content normal.  Nursing note and vitals reviewed.   Imaging: Mr Brain Wo Contrast  Result Date: 08/16/2017 CLINICAL DATA:  Acute onset LEFT-sided weakness. Follow up stroke. Status post endovascular revascularization of occluded RIGHT MCA. EXAM: MRI HEAD WITHOUT CONTRAST TECHNIQUE: Multiplanar, multiecho pulse sequences of the brain and surrounding structures were obtained without intravenous contrast. COMPARISON:  CT HEAD August 07, 2017 FINDINGS: INTRACRANIAL CONTENTS: Confluent reduced diffusion RIGHT frontal lobes including insula and operculum with a few subcentimeter foci reduced diffusion RIGHT parietal lobe, all areas demonstrate low ADC values. Faint susceptibility artifact RIGHT frontal lobe and area of reduced diffusion. If no susceptibility artifact to suggest lobar hematoma. No midline shift or significant mass effect. No parenchymal brain volume loss for age. No hydrocephalus. No abnormal extra-axial fluid collections. VASCULAR: Normal major intracranial vascular flow voids present at skull base. SKULL AND UPPER CERVICAL SPINE: No abnormal sellar expansion. No suspicious calvarial bone marrow signal. Craniocervical junction maintained. SINUSES/ORBITS: Mild paranasal sinus mucosal thickening with small maxillary sinus mucosal retention cyst. Bilateral mastoid effusions. Layering secretions in the pharynx. Included ocular globes and orbital contents are non-suspicious. OTHER: Life-support lines in place. IMPRESSION: Acute small to moderate RIGHT frontoparietal/MCA territory infarct with petechial hemorrhage. Electronically Signed   By: Awilda Metro M.D.   On: 08/16/2017 02:00   Ct Abdomen Pelvis W Contrast  Result Date: 08/16/2017 CLINICAL DATA:  Recent cerebral angiography. Concern for  retroperitoneal bleed. EXAM: CT  ABDOMEN AND PELVIS WITH CONTRAST TECHNIQUE: Multidetector CT imaging of the abdomen and pelvis was performed using the standard protocol following bolus administration of intravenous contrast. CONTRAST:  OMNIPAQUE IOHEXOL 300 MG/ML  SOLN COMPARISON:  CT abdomen pelvis 08/06/2017 FINDINGS: LOWER CHEST: Small pleural effusions with associated atelectasis. HEPATOBILIARY: Normal hepatic contours and density. No intra- or extrahepatic biliary dilatation. Vicarious excretion of contrast in the gallbladder. PANCREAS: Normal parenchymal contours without ductal dilatation. No peripancreatic fluid collection. SPLEEN: Normal. ADRENALS/URINARY TRACT: --Adrenal glands: Normal. --Right kidney/ureter: Mild hypoattenuation of the interpolar aspect of the right kidney. --Left kidney/ureter: No hydronephrosis, nephroureterolithiasis, perinephric stranding or solid renal mass. --Urinary bladder: Decompressed by Foley catheter. STOMACH/BOWEL: --Stomach/Duodenum: No hiatal hernia or other gastric abnormality. Normal duodenal course. --Small bowel: No dilatation or inflammation. --Colon: No focal abnormality. --Appendix: Normal. VASCULAR/LYMPHATIC: Normal course and caliber of the major abdominal vessels. No abdominal or pelvic lymphadenopathy. REPRODUCTIVE: Normal uterus and ovaries. MUSCULOSKELETAL. No bony spinal canal stenosis or focal osseous abnormality. OTHER: Findings of recent right femoral arterial catheter access without retroperitoneal hematoma or other collection. IMPRESSION: 1. No retroperitoneal hematoma. 2. Small pleural effusions with associated atelectasis. 3. Unchanged mild hypoattenuation of the interpolar right kidney. Correlate with urinalysis. Electronically Signed   By: Deatra Robinson M.D.   On: 08/16/2017 14:51   Dg Chest Port 1 View  Result Date: 08/17/2017 CLINICAL DATA:  Shortness of breath EXAM: PORTABLE CHEST 1 VIEW COMPARISON:  08/15/2017 FINDINGS: Left base  atelectasis. Heart is borderline in size with mild vascular congestion. No confluent opacity on the right. No visible effusions or pneumothorax. Interval removal of endotracheal tube and NG tube. IMPRESSION: Left base atelectasis. Borderline heart size, vascular congestion. Electronically Signed   By: Charlett Nose M.D.   On: 08/17/2017 09:19   Dg Chest Port 1 View  Result Date: 08/15/2017 CLINICAL DATA:  Check endotracheal tube placement EXAM: PORTABLE CHEST 1 VIEW COMPARISON:  None. FINDINGS: Endotracheal tube and nasogastric catheter are noted in satisfactory position. Cardiac shadow is accentuated by the portable technique. The lungs are well aerated bilaterally. Patchy atelectatic changes are noted in the left base. No bony abnormality is noted. IMPRESSION: Tubes and lines as described above. Patchy left basilar atelectasis. Electronically Signed   By: Alcide Clever M.D.   On: 08/15/2017 07:12    Labs:  CBC: Recent Labs    08/16/17 0346 08/16/17 1449 08/17/17 0144 08/17/17 0847  WBC 10.1  --  11.3*  --   HGB 5.9* 8.3* 8.4* 8.3*  HCT 21.7* 28.2* 28.1* 28.1*  PLT 382  --  302  --     COAGS: Recent Labs    08/17/17 0144  INR 1.21  APTT 31    BMP: Recent Labs    08/15/17 0728 08/16/17 0807 08/17/17 0144  NA 139 138 138  K 3.6 3.2* 3.2*  CL 112* 106 109  CO2 19* 22 22  GLUCOSE 109* 114* 89  BUN <5* <5* <5*  CALCIUM 7.5* 8.1* 8.4*  CREATININE 0.45 0.39* 0.48  GFRNONAA >60 >60 >60  GFRAA >60 >60 >60    LIVER FUNCTION TESTS: Recent Labs    08/15/17 0728 08/17/17 0144  BILITOT 0.3 0.8  AST 22 23  ALT 17 14  ALKPHOS 84 83  PROT 6.4* 6.4*  ALBUMIN 2.7* 2.8*    Assessment and Plan:  Right MCA inferior division occlusion s/p revascularization 08/15/2017 with Dr. Corliss Skains. Patient's condition improving- can spontaneously move all extremities and follow commands with left side, however 3/5  strength of left arm- she is able to abduct shoulder to 90 degrees but point  to point discrimination slower on left side. Right groin incision stable. Appreciate and agree with neurology management. Plan to follow-up with Dr. Corliss Skainseveshwar in clinic 4 weeks after discharge. Please call IR with questions/concerns.   Electronically Signed: Elwin MochaAlexandra Jafeth Mustin, PA-C 08/17/2017, 10:34 AM   I spent a total of 15 Minutes at the the patient's bedside AND on the patient's hospital floor or unit, greater than 50% of which was counseling/coordinating care for right MCA inferior division occlusion s/p revascularization.

## 2017-08-17 NOTE — Care Management Note (Signed)
Case Management Note  Patient Details  Name: Stacie Marshall MRN: [id number] Date of Birth: [birth year]  Subjective/Objective: Pt admitted on 08/15/17 with acute RT M2 occlusion s/p TPA and thrombectomy.  PTA, pt independent, lives at home with significant other and children.                    Action/Plan: PT recommending no OP follow up; OT recommending HH follow up. Family able to provide assistance at dc.  Will continue to follow for dc needs.  Pt is uninsured and has no PCP; may need medication assistance at discharge.  [facility] [place] for PCP follow up.    Expected Discharge Date:                  Expected Discharge Plan:  Home w Home Health Services  In-House Referral:     Discharge planning Services  CM Consult  Post Acute Care Choice:    Choice offered to:     DME Arranged:    DME Agency:     HH Arranged:    HH Agency:     Status of Service:  In process, will continue to follow  If discussed at Long Length of Stay Meetings, dates discussed:    Additional Comments:  Quintella BatonJulie W. Karmin Kasprzak, RN, BSN  Trauma/Neuro ICU Case Manager [phone]

## 2017-08-17 NOTE — Evaluation (Signed)
Physical Therapy Evaluation Patient Details Name: Stacie Marshall MRN: [id number] DOB: 12/05/1975 Today's Date: 08/17/2017   History of Present Illness  42 year old female with PMH of ETOH. [facility] with acute left side weakness. Given TPA. CTA with Right M2 Occlusion. [facility] completed on 7/28. Pt intubated from 7/27-7/29 in am. 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   Clinical Impression  Orders received for PT evaluation. Patient demonstrates deficits in functional mobility as indicated below. Will benefit from continued skilled PT to address deficits and maximize function. Will see as indicated and progress as tolerated.  Anticipate patient will progress well, no post acute follow up recommended at this time.    Follow Up Recommendations No PT follow up;Supervision - Intermittent    Equipment Recommendations  None recommended by PT    Recommendations for Other Services       Precautions / Restrictions Precautions Precautions: Fall Restrictions Weight Bearing Restrictions: No      Mobility  Bed Mobility Overal bed mobility: Needs Assistance Bed Mobility: Supine to Sit     Supine to sit: Min guard     General bed mobility comments: min guard for safety, but no physical assist required.  Transfers Overall transfer level: Needs assistance   Transfers: Sit to/from Stand Sit to Stand: Min guard;Min assist         General transfer comment: min assist for stability in rising to upright from bed and toilet  Ambulation/Gait Ambulation/Gait assistance: Min guard;Min assist   Assistive device: 1 person hand held assist Gait Pattern/deviations: Step-through pattern;Decreased stride length;Drifts right/left Gait velocity: decreased   General Gait Details: modest instability noted with ambulation, no overt LOB at this time  Stairs             Wheelchair Mobility    Modified Rankin (Stroke Patients Only) Modified Rankin (Stroke Patients Only) Pre-Morbid Rankin Score: No symptoms Modified Rankin: Moderate disability     Balance Overall balance assessment: Needs assistance Sitting-balance support: Feet supported Sitting balance-Leahy Scale: Good       Standing balance-Leahy Scale: Fair Standing balance comment: able to static stand, some increased sway and reliance on UE support intermittently             High level balance activites: Direction changes;Head turns;Turns High Level Balance Comments: modest instability with higer level tasks             Pertinent Vitals/Pain Pain Assessment: Faces Faces Pain Scale: Hurts little more Pain Location: headache Pain Descriptors / Indicators: Headache Pain Intervention(s): Monitored during session    Home Living Family/patient expects to be discharged to:: Private residence Living Arrangements: Spouse/significant other;Children Available Help at Discharge: Family Type of Home: Mobile home Home Access: Stairs to enter Entrance Stairs-Rails: Left Entrance Stairs-Number of Steps: 7 Home Layout: One level Home Equipment: None      Prior Function Level of Independence: Independent               Hand Dominance   Dominant Hand: Left    Extremity/Trunk Assessment   Upper Extremity Assessment Upper Extremity Assessment: LUE deficits/detail LUE Deficits / Details: noted LUE weakness, edemous  LUE Coordination: decreased fine motor;decreased gross motor    Lower Extremity Assessment Lower Extremity Assessment: LLE deficits/detail LLE Deficits / Details: modest assymetry but overall within functional limits       Communication   Communication: No difficulties  Cognition Arousal/Alertness:  Awake/alert Behavior During Therapy: WFL for tasks assessed/performed Overall Cognitive Status: Within Functional Limits for tasks assessed                                         General Comments      Exercises     Assessment/Plan    PT Assessment Patient needs continued PT services  PT Problem List Decreased activity tolerance;Decreased balance;Decreased mobility       PT Treatment Interventions DME instruction;Gait training;Stair training;Functional mobility training;Therapeutic activities;Therapeutic exercise;Balance training;Neuromuscular re-education;Patient/family education    PT Goals (Current goals can be found in the Care Plan section)  Acute Rehab PT Goals Patient Stated Goal: to go home  PT Goal Formulation: With patient Time For Goal Achievement: 08/31/17 Potential to Achieve Goals: Good    Frequency Min 4X/week   Barriers to discharge        Co-evaluation PT/OT/SLP Co-Evaluation/Treatment: Yes Reason for Co-Treatment: For patient/therapist safety;To address functional/ADL transfers PT goals addressed during session: Mobility/safety with mobility OT goals addressed during session: ADL's and self-care       AM-PAC PT "6 Clicks" Daily Activity  Outcome Measure Difficulty turning over in bed (including adjusting bedclothes, sheets and blankets)?: A Little Difficulty moving from lying on back to sitting on the side of the bed? : A Little Difficulty sitting down on and standing up from a chair with arms (e.g., wheelchair, bedside commode, etc,.)?: Unable Help needed moving to and from a bed to chair (including a wheelchair)?: A Little Help needed walking in hospital room?: A Little Help needed climbing 3-5 steps with a railing? : A Lot 6 Click Score: 15    End of Session Equipment Utilized During Treatment: Gait belt Activity Tolerance: Patient tolerated treatment well Patient left: in chair;with call bell/phone within reach;with family/visitor present;with chair alarm set Nurse Communication: Mobility status PT Visit Diagnosis: Unsteadiness on feet (R26.81);Difficulty in walking, not  elsewhere classified (R26.2);Other symptoms and signs involving the nervous system (R29.898)    Time: 1610-9604 PT Time Calculation (min) (ACUTE ONLY): 20 min   Charges:   PT Evaluation $PT Eval Moderate Complexity: 1 Mod          Charlotte Crumb, PT DPT  Board Certified Neurologic Specialist [phone]   Fabio Asa 08/17/2017, 1:31 PM

## 2017-08-17 NOTE — Progress Notes (Signed)
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 Received TEE request without loop from [employer]. Due to ETOH, needs MAC -> next available case is Friday @ 11:45am with Dr. Acie Fredrickson. Someone from cardiology team will be by within the next few days to review procedure with patient and place orders. Dayna Dunn PA-C

## 2017-08-17 NOTE — Progress Notes (Signed)
PULMONARY / CRITICAL CARE MEDICINE   Name: Stacie Marshall MRN: [id number] DOB: 04/11/1975    ADMISSION DATE:  08/15/2017 CONSULTATION DATE:  08/15/2017  REFERRING MD:  Dr. Amada JupiterKirkpatrick   CHIEF COMPLAINT:  CVA  HISTORY OF PRESENT ILLNESS:   42 year old female with PMH of ETOH.  [facility] with acute left side weakness. Given TPA. CTA with Right M2 Occlusion. [facility]. PCCM asked to consult for vent management.     Significant event: 08/15/2017: Admission, TPA, agitation intubation, [facility], [facility] 08/16/2017: SBT, hemoglobin dropped to 5.9 (hemoglobin at [facility] 8.1) 08/17/2017: Liberated from ventilator yesterday, abdominal CT done did not show any bleeding  Drips: Propofol  STUDIES:  MRI Brain 7/28 >> ECHO 7/28 >> EF 60 to 65% CT abdomen and pelvis no evidence of bleeding  CULTURES: None.   ANTIBIOTICS: Ancef 7/28-Keflex-started for possible UTI by primary  LINES/TUBES: ETT 7/27 >>  Right Arterial Sheath 7/27 >> removed 08/15/2017  Subjective: More awake and responsive today morning, follow simple commands, Liberated from ventilator yesterday, abdominal CT done did not show any bleeding -Baseline anemia per history -Overall patient is doing well off all the drips   REVIEW OF SYSTEMS:   Unable to review as patient is intubated/sedated   SUBJECTIVE:   VITAL SIGNS: BP 111/77 (BP Location: Left Arm)   Pulse 70   Temp 98.7 F (37.1 C) (Oral)   Resp 20   Ht 5' (1.524 m)   Wt 193 lb 5.5 oz (87.7 kg)   SpO2 98%   BMI 37.76 kg/m   HEMODYNAMICS:    VENTILATOR SETTINGS:    INTAKE / OUTPUT: I/O last 3 completed shifts: In: 4212.9 [I.V.:3469.5; Blood:630; IV Piggyback:113.4] Out: 3475 [Urine:1575; Emesis/NG output:1900]  PHYSICAL EXAMINATION: General:  Adult female, on vent  Neuro:  Sedated, does not follow commands HEENT:  ETT in place  Cardiovascular:  RRR, no MRG Lungs:   Clear breath sounds, no wheeze/crackles  Abdomen:  Obese, active bowel sounds  Musculoskeletal:  -edema  Skin:  Warm, dry   LABS:  BMET Recent Labs  Lab 08/15/17 0728 08/16/17 0807 08/17/17 0144  NA 139 138 138  K 3.6 3.2* 3.2*  CL 112* 106 109  CO2 19* 22 22  BUN <5* <5* <5*  CREATININE 0.45 0.39* 0.48  GLUCOSE 109* 114* 89    Electrolytes Recent Labs  Lab 08/15/17 0728 08/16/17 0807 08/16/17 1449 08/17/17 0144  CALCIUM 7.5* 8.1*  --  8.4*  MG  --   --  2.0 2.0    CBC Recent Labs  Lab 08/16/17 0346 08/16/17 1449 08/17/17 0144  WBC 10.1  --  11.3*  HGB 5.9* 8.3* 8.4*  HCT 21.7* 28.2* 28.1*  PLT 382  --  302    Coag's Recent Labs  Lab 08/17/17 0144  APTT 31  INR 1.21    Sepsis Markers No results for input(s): LATICACIDVEN, PROCALCITON, O2SATVEN in the last 168 hours.  ABG Recent Labs  Lab 08/15/17 0636 08/16/17 0958  PHART 7.325* 7.477*  PCO2ART 35.4 32.5  PO2ART 92.0 71.0*    Liver Enzymes Recent Labs  Lab 08/15/17 0728 08/17/17 0144  AST 22 23  ALT 17 14  ALKPHOS 84 83  BILITOT 0.3 0.8  ALBUMIN 2.7* 2.8*    Cardiac Enzymes No results for input(s): TROPONINI, PROBNP in the last 168 hours.  Glucose Recent Labs  Lab 08/16/17 1123 08/16/17 1515 08/16/17 1936 08/16/17 2316 08/17/17  0315 08/17/17 0757  GLUCAP 94 89 83 97 89 80    Imaging Ct Abdomen Pelvis W Contrast  Result Date: 08/16/2017 CLINICAL DATA:  Recent cerebral angiography. Concern for retroperitoneal bleed. EXAM: CT ABDOMEN AND PELVIS WITH CONTRAST TECHNIQUE: Multidetector CT imaging of the abdomen and pelvis was performed using the standard protocol following bolus administration of intravenous contrast. CONTRAST:  OMNIPAQUE IOHEXOL 300 MG/ML  SOLN COMPARISON:  CT abdomen pelvis 08/06/2017 FINDINGS: LOWER CHEST: Small pleural effusions with associated atelectasis. HEPATOBILIARY: Normal hepatic contours and density. No intra- or extrahepatic biliary  dilatation. Vicarious excretion of contrast in the gallbladder. PANCREAS: Normal parenchymal contours without ductal dilatation. No peripancreatic fluid collection. SPLEEN: Normal. ADRENALS/URINARY TRACT: --Adrenal glands: Normal. --Right kidney/ureter: Mild hypoattenuation of the interpolar aspect of the right kidney. --Left kidney/ureter: No hydronephrosis, nephroureterolithiasis, perinephric stranding or solid renal mass. --Urinary bladder: Decompressed by Foley catheter. STOMACH/BOWEL: --Stomach/Duodenum: No hiatal hernia or other gastric abnormality. Normal duodenal course. --Small bowel: No dilatation or inflammation. --Colon: No focal abnormality. --Appendix: Normal. VASCULAR/LYMPHATIC: Normal course and caliber of the major abdominal vessels. No abdominal or pelvic lymphadenopathy. REPRODUCTIVE: Normal uterus and ovaries. MUSCULOSKELETAL. No bony spinal canal stenosis or focal osseous abnormality. OTHER: Findings of recent right femoral arterial catheter access without retroperitoneal hematoma or other collection. IMPRESSION: 1. No retroperitoneal hematoma. 2. Small pleural effusions with associated atelectasis. 3. Unchanged mild hypoattenuation of the interpolar right kidney. Correlate with urinalysis. Electronically Signed   By: Deatra Robinson M.D.   On: 08/16/2017 14:51       DISCUSSION: 42 year old with right M2 Occlusion s/p Thrombectomy.  Hemoglobin stable today ASSESSMENT / PLAN:  Respiratory Insufficieny s/p CVA Plan  -Liberated from ventilator doing well -Aspiration precaution -Pulmonary Hygiene   HTN Plan  -Cardiac Monitoring -Maintain Systolic 120-140 (Use Neo/Cardene as needed) -ECHO done EF is normal, per the notes seems like patient might require TEE by neurology  Right M2 Occlusion s/p Thrombectomy  H/O ETOH  Plan  -Per Neurology-continue aspirin per neurology -PT/OT/ST -Frequent Neuro Checks  -MRI Acute small to moderate RIGHT frontoparietal/MCA territory  infarct with petechial hemorrhage. -Folic Acid/Thimine-history of EtOH monitor with CIWA protocol  Anemia: -Drop in hemoglobin 5.9-status post blood transfusion- hemoglobin stable around 8.4 now - No evidence of active bleeding - As patient had received TPA and had a right femoral sheath-abdominal CT was done did not reveal any evidence of active bleeding - Stool for occult blood pending - Received 2 units of PRBC (08/16/2017) -Trend hemoglobin keep it more than 7  Hypokalemia: Replaced  SUP Plan  -NPO -PPI    FAMILY  - Updates: Family updated at bedside.   - Inter-disciplinary family meet or Palliative Care meeting due by: 08/22/2017    Skin/Wound: Chronic change, Sheath site looks clean  Electrolytes: Replace electrolytes per ICU electrolyte replacement protocol.   IVF: none  Nutrition: Started on diet  Prophylaxis: DVT Prophylaxis with SCD,. GI Prophylaxis.   Restraints: Soft limb  PT/OT eval and treat. OOB when appropriate.   Lines/Tubes: Foley removed 08/16/2017 no central line. A line  ADVANCE DIRECTIVE:Full code  FAMILY DISCUSSION:Spoke with son  Quality Care: PPI, DVT prophylaxis, HOB elevated, Infection control all reviewed and addressed.  Events and notes from last 24 hours reviewed. Care plan discussed on multidisciplinary rounds  CC TIME:32 min   -Overall doing well spoke with neurological team possible transfer out of the ICU we will sign off please call us back if we can be of further help  Old records reviewed discussed results and management plan with patient  Images personally reviewed and results and labs reviewed and discussed with patient.  All medication reviewed and adjusted  Further management depending on test results and work up as outlined above.    Roseanne Reno, M.D

## 2017-08-17 NOTE — Evaluation (Signed)
Clinical/Bedside Swallow Evaluation Patient Details  Name: Stacie Marshall MRN: [id number] Date of Birth: 03/06/1975  Today's Date: 08/17/2017 Time: SLP Start TimDebbrah Alare (ACUTE ONLY): 0907 SLP Stop Time (ACUTE ONLY): 0921 SLP Time Calculation (min) (ACUTE ONLY): 14 min  HPI:  42 year old female with PMH of ETOH. [facility] with acute left side weakness. Given TPA. CTA with Right M2 Occlusion. Transferred to [facility] for Thrombectomy completed on 7/28. Pt intubated from 7/27-7/29 in am. 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   Assessment / Plan / Recommendation Clinical Impression  Pt demonstrates no evidence of dysphagia, consumed solids adequately, 3 oz of water taken consecutively with no signs of aspiration. Pt to continue current diet, no SLP f/u needed will sign off.  SLP Visit Diagnosis: Dysphagia, unspecified (R13.10)    Aspiration Risk       Diet Recommendation Regular;Thin liquid             Swallow Study   General HPI: 42 year old female with PMH of ETOH. [facility] with acute left side weakness. Given TPA. CTA with Right M2 Occlusion. Transferred to [facility] for Thrombectomy completed on 7/28. Pt intubated from 7/27-7/29 in am. 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 Type of Study: Bedside Swallow Evaluation Previous Swallow Assessment: none Diet Prior to this Study: Regular;Thin liquids Temperature Spikes Noted: No Respiratory Status: Room air History of Recent Intubation: Yes Length of Intubations (days): 3 days Date extubated: 08/16/17 Behavior/Cognition: Alert;Cooperative;Pleasant mood Oral Cavity Assessment: Within Functional Limits Oral Care Completed by SLP: No Oral Cavity - Dentition: Adequate natural dentition Vision: Functional for self-feeding Self-Feeding Abilities: Able to feed  self Patient Positioning: Upright in bed Baseline Vocal Quality: Normal Volitional Cough: Strong Volitional Swallow: Able to elicit    Oral/Motor/Sensory Function Overall Oral Motor/Sensory Function: Mild impairment Facial Symmetry: Abnormal symmetry right Facial Strength: Within Functional Limits Facial Sensation: Within Functional Limits   Ice Chips     Thin Liquid Thin Liquid: Within functional limits Presentation: Straw;Cup    Nectar Thick Nectar Thick Liquid: Not tested   Honey Thick Honey Thick Liquid: Not tested   Puree Puree: Not tested   Solid     Solid: Within functional limits      Rj Pedrosa, Riley NearingBonnie Caroline 08/17/2017,9:27 AM

## 2017-08-17 NOTE — Evaluation (Signed)
Speech Language Pathology Evaluation Patient Details Name: Debbrah AlarMaury Castillo Reynoso MRN: [id number] DOB: 08-Jul-1975 Today's Date: 08/17/2017 Time: [id number] SLP Time Calculation (min) (ACUTE ONLY): 14 min  Problem List:  Patient Active Problem List   Diagnosis Date Noted  . Stroke (cerebrum) (HCC) 08/15/2017  . 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 No pertinent past medical history.  [age] year old female with PMH of ETOH. [facility] with acute left side weakness. Given TPA. CTA with Right M2 Occlusion. Transferred to [facility] for Thrombectomy completed on 7/28. Pt intubated from 7/27-7/29 in am. 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   Assessment / Plan / Recommendation Clinical Impression  Pt demonstrates speech, language cognitive function WNL. Pt assessed in [place] and [place]. No SLP f/u needed. Will sign off.     SLP Assessment  SLP Recommendation/Assessment: Patient does not need any further Speech Lanaguage Pathology Services SLP Visit Diagnosis: Dysarthria and anarthria (R47.1)    Follow Up Recommendations       Frequency and Duration           SLP Evaluation Cognition  Overall Cognitive Status: Within Functional Limits for tasks assessed Orientation Level: Oriented X4       Comprehension  Auditory Comprehension Overall Auditory Comprehension: Appears within functional limits for tasks assessed    Expression Verbal Expression Overall Verbal Expression: Appears within functional limits for tasks assessed Written Expression Dominant Hand: Right   Oral / Motor  Oral Motor/Sensory Function Overall Oral Motor/Sensory Function: Mild impairment Facial Symmetry: Abnormal symmetry right Facial Strength: Within Functional Limits Facial Sensation: Within Functional Limits Motor Speech Overall Motor Speech: Appears within functional  limits for tasks assessed   GO                   [facility] P[facility]nie Esthefany Herrig, MA CCC-SLP [phone]  Claudine MoutonDeBlois, Zakyah Yanes Caroline 08/17/2017, 9:24 AM

## 2017-08-17 NOTE — Progress Notes (Signed)
Patient transferred to 3W31 from 4N ICU. Safety precautions and orders reviewed with patient/family. VSS. TELE applied and confirmed. NO other distress noted. Will continue to monitor.   Sim BoastHavy, RN

## 2017-08-18 ENCOUNTER — Encounter (HOSPITAL_COMMUNITY): Payer: Self-pay

## 2017-08-18 LAB — BETA-2-GLYCOPROTEIN I ABS, IGG/M/A: Beta-2 Glyco I IgG: <9 GPI IgG units (ref 0–20)

## 2017-08-18 LAB — HEMOGLOBIN AND HEMATOCRIT, BLOOD
HEMATOCRIT: 28.2 % — AB (ref 36.0–46.0)
Hemoglobin: 8.3 g/dL — ABNORMAL LOW (ref 12.0–15.0)

## 2017-08-18 LAB — CBC
HCT: 29.9 % — ABNORMAL LOW (ref 36.0–46.0)
Hemoglobin: 8.9 g/dL — ABNORMAL LOW (ref 12.0–15.0)
MCH: 19.6 pg — ABNORMAL LOW (ref 26.0–34.0)
MCHC: 29.8 g/dL — ABNORMAL LOW (ref 30.0–36.0)
MCV: 65.9 fL — ABNORMAL LOW (ref 78.0–100.0)
Platelets: 356 10*3/uL (ref 150–400)
RBC: 4.54 MIL/uL (ref 3.87–5.11)
RDW: 27.4 % — AB (ref 11.5–15.5)
WBC: 11.3 10*3/uL — AB (ref 4.0–10.5)

## 2017-08-18 LAB — BASIC METABOLIC PANEL
ANION GAP: 11 (ref 5–15)
BUN: <5 mg/dL — ABNORMAL LOW (ref 6–20)
CALCIUM: 9.1 mg/dL (ref 8.9–10.3)
CO2: 21 mmol/L — ABNORMAL LOW (ref 22–32)
Chloride: 105 mmol/L (ref 98–111)
Creatinine, Ser: 0.45 mg/dL (ref 0.44–1.00)
GFR calc non Af Amer: >60 mL/min (ref >60)
Glucose, Bld: 88 mg/dL (ref 70–99)
Potassium: 3.6 mmol/L (ref 3.5–5.1)
Sodium: 137 mmol/L (ref 135–145)

## 2017-08-18 LAB — ANTINUCLEAR ANTIBODIES, IFA: ANTINUCLEAR ANTIBODIES, IFA: NEGATIVE

## 2017-08-18 MED ORDER — ASPIRIN EC 81 MG PO TBEC
81.0000 mg | DELAYED_RELEASE_TABLET | Freq: Every day | ORAL | Status: DC
  Administered 2017-08-19: 81 mg via ORAL
  Filled 2017-08-18: qty 1

## 2017-08-18 MED ORDER — CEPHALEXIN 500 MG PO CAPS
500.0000 mg | ORAL_CAPSULE | Freq: Two times a day (BID) | ORAL | Status: DC
  Administered 2017-08-18 – 2017-08-19 (×3): 500 mg via ORAL
  Filled 2017-08-18 (×3): qty 1

## 2017-08-18 NOTE — Progress Notes (Signed)
Patient resting comfortably VSS, significant other in room will continue to monitor.

## 2017-08-18 NOTE — Progress Notes (Signed)
Occupational Therapy Treatment Patient Details Name: Stacie Marshall MRN: [id number] DOB: 1975/05/02 Today's Date: 08/18/2017    History of present illness 42 year old female with PMH of ETOH. Presents to [facility] ED on 7/27 with acute left side weakness. Given TPA. CTA with Right M2 Occlusion. Transferred to [facility] for Thrombectomy completed on 7/28. Pt intubated from 7/27-7/29 in am. 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    OT comments  Pt progressing towards OT goals, presents supine in bed agreeable to OT tx session. Pt demonstrating functional mobility and tub transfer this session without AD and overall minguard assist. Further educated on LUE HEP and fine motor/coordination activities with handout provided and pt verbalizing and return demonstrating understanding. Pt demonstrating improvements in LUE FMC compared to previous session. Discharge recommendations have been updated to reflect pt's progress. Will continue to follow acutely to progress pt towards established OT goals.    Follow Up Recommendations  Supervision/Assistance - 24 hour;No OT follow up    Equipment Recommendations  None recommended by OT          Precautions / Restrictions Precautions Precautions: Fall Restrictions Weight Bearing Restrictions: No       Mobility Bed Mobility Overal bed mobility: Needs Assistance Bed Mobility: Supine to Sit     Supine to sit: Supervision     General bed mobility comments: for general safety   Transfers Overall transfer level: Needs assistance   Transfers: Sit to/from Stand Sit to Stand: Min guard         General transfer comment: min guard for safety    Balance Overall balance assessment: Needs assistance Sitting-balance support: Feet supported Sitting balance-Leahy Scale: Good     Standing balance support: No upper extremity supported Standing balance-Leahy Scale:  Fair Standing balance comment: minguard for dynamic mobility              High level balance activites: Turns;Sudden stops;Head turns;Other (comment)(speed up/slow down) High Level Balance Comments: modest instability with higer level tasks           ADL either performed or assessed with clinical judgement   ADL                                   Tub/ Shower Transfer: Min guard;Ambulation Tub/Shower Transfer Details (indicate cue type and reason): using UE support on wall during transfers; minguard for safety  Functional mobility during ADLs: Min guard                         Cognition Arousal/Alertness: Awake/alert Behavior During Therapy: WFL for tasks assessed/performed Overall Cognitive Status: Within Functional Limits for tasks assessed                                          Exercises Hand Exercises Digit Composite Flexion: AROM;10 reps;Left;Seated Composite Extension: AROM;10 reps;Left;Seated Digit Composite Abduction: AROM;10 reps;Left;Seated Digit Composite Adduction: AROM;10 reps;Seated;Left Digit Lifts: AROM;Left;Seated;5 reps Opposition: AROM;5 reps;Left;Seated Other Exercises Other Exercises: educated on general fine motor/coordination activities to complete at home with corresponding handout provided    Shoulder Instructions       General Comments      Pertinent Vitals/ Pain       Pain Assessment: Faces Faces Pain Scale:  No hurt Pain Intervention(s): Monitored during session  Home Living                                          Prior Functioning/Environment              Frequency  Min 3X/week        Progress Toward Goals  OT Goals(current goals can now be found in the care plan section)  Progress towards OT goals: Progressing toward goals  Acute Rehab OT Goals Patient Stated Goal: to go home  OT Goal Formulation: With patient Time For Goal Achievement:  08/31/17 Potential to Achieve Goals: Good  Plan Discharge plan needs to be updated    Co-evaluation                 AM-PAC PT "6 Clicks" Daily Activity     Outcome Measure   Help from another person eating meals?: None Help from another person taking care of personal grooming?: None Help from another person toileting, which includes using toliet, bedpan, or urinal?: A Little Help from another person bathing (including washing, rinsing, drying)?: A Little Help from another person to put on and taking off regular upper body clothing?: None Help from another person to put on and taking off regular lower body clothing?: A Little 6 Click Score: 21    End of Session Equipment Utilized During Treatment: Gait belt  OT Visit Diagnosis: Muscle weakness (generalized) (M62.81);Other symptoms and signs involving the nervous system (R29.898)   Activity Tolerance Patient tolerated treatment well   Patient Left in chair;with call bell/phone within reach;with family/visitor present   Nurse Communication Mobility status        Time: [id number] OT Time Calculation (min): 23 min  Charges: OT General Charges $OT Visit: 1 Visit OT Treatments $Therapeutic Activity: 8-22 mins  Marcy SirenBreanna Kameka Whan, OT Pager [phone] 08/18/2017    Orlando PennerBreanna L Epifania Littrell 08/18/2017, 1:49 PM

## 2017-08-18 NOTE — Progress Notes (Addendum)
STROKE TEAM PROGRESS NOTE   SUBJECTIVE (INTERVAL HISTORY) Her son is at the beside. She is up in the chair at the bedside. Excited she only needs OT. Ready for d/c when workup completed. TEE scheduled for Friday. Hoping it will be moved up.    OBJECTIVE CBC:  Recent Labs  Lab 08/16/17 0346  08/17/17 0144  08/18/17 0136 08/18/17 0811  WBC 10.1  --  11.3*  --   --  11.3*  NEUTROABS 7.0  --  8.2*  --   --   --   HGB 5.9*   < > 8.4*   < > 8.3* 8.9*  HCT 21.7*   < > 28.1*   < > 28.2* 29.9*  MCV 61.6*  --  65.8*  --   --  65.9*  PLT 382  --  302  --   --  356   < > = values in this interval not displayed.    Basic Metabolic Panel:  Recent Labs  Lab 08/16/17 1449 08/17/17 0144 08/18/17 0811  NA  --  138 137  K  --  3.2* 3.6  CL  --  109 105  CO2  --  22 21*  GLUCOSE  --  89 88  BUN  --  <5* <5*  CREATININE  --  0.48 0.45  CALCIUM  --  8.4* 9.1  MG 2.0 2.0  --     Lipid Panel:     Component Value Date/Time   CHOL 127 08/15/2017 0729   TRIG 208 (H) 08/15/2017 0729   TRIG 200 (H) 08/15/2017 0729   HDL 23 (L) 08/15/2017 0729   CHOLHDL 5.5 08/15/2017 0729   VLDL 42 (H) 08/15/2017 0729   LDLCALC 62 08/15/2017 0729   HgbA1c:  Lab Results  Component Value Date   HGBA1C 5.8 (H) 08/15/2017   Urine Drug Screen:     Component Value Date/Time   LABOPIA NONE DETECTED 08/15/2017 1425   COCAINSCRNUR NONE DETECTED 08/15/2017 1425   LABBENZ POSITIVE (A) 08/15/2017 1425   AMPHETMU NONE DETECTED 08/15/2017 1425   THCU NONE DETECTED 08/15/2017 1425   LABBARB NONE DETECTED 08/15/2017 1425     IMAGING CXR 08/15/2017 Tubes and lines as described above. Patchy left basilar atelectasis.  MRI Brain WO Contrast  08/16/2017 Acute small to moderate RIGHT frontoparietal/MCA territory infarct with petechial hemorrhage.  CT Abdomen and Pelvis with Contrast 08/16/2017 1. No retroperitoneal hematoma. 2. Small pleural effusions with associated atelectasis. 3. Unchanged mild  hypoattenuation of the interpolar right kidney. Correlate with urinalysis.  IR Thrombectomy 08/15/2017 S/P rt common carotid arteriogram followed by complete revascularization of occluded Rt MCA dominant inf division with x 1 pass with embotrap 5 mmm x 28m device achieving a TICI 3 reperfusion  Transthoracic Echocardiogram 08/16/2017 Left ventricle: The cavity size was normal. Systolic function was normal. The estimated ejection fraction was in the range of 60% to 65%. Wall motion was normal; there were no regional wall motion abnormalities. Left ventricular diastolic function parameters were normal.  CXR 08/17/2017 Left base atelectasis. Borderline heart size, vascular congestion.  LE Venous Dopplers - no DVT  CUS - Bilateral: 1-39% ICA stenosis. Vertebral artery flow is antegrade.  TEE pending Friday   PHYSICAL EXAM Vitals:   08/17/17 2037 08/17/17 2355 08/18/17 0400 08/18/17 0839  BP: (!) 112/44 (!) 120/95 128/76 130/63  Pulse: 66 67 72 71  Resp: 16 14 15 18   Temp: 98.4 F (36.9 C) 98.2 F (36.8 C) 98.7 F (37.1  C) 98.9 F (37.2 C)  TempSrc: Oral Oral Oral Oral  SpO2: 100% 98% 98% 98%  Weight:      Height:       General - Well nourished, well developed, up in chair  Cardiovascular - Regular rate and rhythm  Neuro - awake, alert and oriented x 3. Speech clear. No aphasia. Can follow all commands. No gaze preference, able to cross midline.  Can count fingers all fiedls. Face symmetric. Tongue midline.  Left upper extremity 3+/5.  Right upper extremity 5/5.  Bilateral lower extremity 5/5.  DTR 2 +, no Babinski.  Sensory symmetrical.  Coordination intact except for LUE d/t weakness. HRR. Breath sounds clear.   ASSESSMENT/PLAN Ms. Dynasty Holquin is a 42 y.o. female with no significant past medical history presenting with left sided weakness. She received  IV TPA per tele-neurology at [facility]. She underwent thrombectomy Rt MCA dominant inf  division.  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  Resultant Left arm weakness  MRI head - Acute small to moderate RIGHT frontoparietal/MCA territory infarct with petechial hemorrhage.  CTA head and neck right M2 occlusion  IR right M2 occlusion status post TICI 3 reperfusion  LE venous Doppler negative for DVT  2D Echo - EF 60 - 65%. No cardiac source of emboli identified.  Carotid Doppler -unremarkable  TEE pending Friday with anesthesia given ETOH hx   Hypercoagulable work-up ESR 25, CRP 5.7, other test normal or pending   LDL - 62  HgbA1c - 5.8  VTE prophylaxis - SCDs  No antithrombotic prior to admission, now on aspirin 384m  Patient counseled to be compliant with her antithrombotic medications  Ongoing aggressive stroke risk factor management  Therapy recommendations:  HH OT, no PT needs. Family supportive  Disposition:  Pending. Medically ready for d/c once TEE done  Acute Blood loss Anemia  Hx Iron deficiency anemia  CT showed no retroperitoneal hematoma  Hb 8.3->5.9->PRBC->8.3->8.9  Low iron and ferritin  On iron supplement  Close monitoring CBC  Alcoholism  6-10 beers on the weekends  On FA/MVI/B1  No signs of DTs. On CIWA protocol  Tobacco abuse  Current smoker  Smoking cessation counseling provided  Pt is willing to quit  Kidney versus bladder infection Leukocytosis  Takes Keflex as outpatient for the last 3 days  Continue Keflex for 10 days total (adjusted end date on order)  UA mod leukocytes, many bacteria, WBC 6-10  CT Abdomen and Pelvis Unchanged mild hypoattenuation of the interpolar right kidney.   WBC 11.3  B12 deficiency  B12 156  B12 supplement daily with IM injection  Change to p.o. once to access  Hyperlipidemia  Lipid lowering medication PTA:  none  LDL 62, goal < 70  Current lipid lowering medication:  Lipitor  10  Continue statin at discharge  Other Stroke Risk Factors  Obesity, Body mass index is 37.76 kg/m., recommend weight loss, diet and exercise as appropriate   UDS positive for benzos. ETOH not performed  Denies OCP use   No family hx clotting d/o  Other Active Problems  Respiratory failure d/t stroke, resolved  Hypokalemia, replaced, 3.2->3.6  Hospital day # 3  SBurnetta Sabin MSN, APRN, ANVP-BC, AGPCNP-BC Advanced Practice Stroke Nurse [place] Schedule & Pager information 08/18/2017 9:48 AM   To contact Stroke Continuity provider, please refer to [email or web address] After hours, contact General Neurology  ATTENDING  NOTE: I reviewed above note and agree with the assessment and plan. I have made any additions or clarifications directly to the above note. Pt was seen and examined.   Met pt this am when she walked with PT in the hallway and stairs. She has been doing well. Gait stable although mildly slow. LUE much improved still has distal weakness though. So far all stroke work up was negative. Pending TEE on Friday. Will need to consider LP too. Plan to do it tomorrow if time allows.   Rosalin Hawking, MD PhD Stroke Neurology 08/18/2017 4:21 PM

## 2017-08-18 NOTE — Progress Notes (Addendum)
    [facility] has been requested to perform a transesophageal echocardiogram on Stacie Marshall for stroke.  The procedure will be done with MAC. After careful review of history and examination, the risks and benefits of transesophageal echocardiogram have been explained including risks of esophageal damage, perforation (1:10,000 risk), bleeding, pharyngeal hematoma as well as other potential complications associated with conscious sedation including aspiration, arrhythmia, respiratory failure and death. Alternatives to treatment were discussed, questions were answered. Patient is willing to proceed.   The procedure is scheduled for Friday 8/2 at 11:45 with Dr. Acie Fredrickson.   Daune Perch, NP  08/18/2017 9:50 AM

## 2017-08-18 NOTE — Progress Notes (Signed)
Physical Therapy Treatment Patient Details Name: Stacie AlarMaury Castillo Marshall MRN: [id number] DOB: [birth year] Today's Date: 08/18/2017    History of Present Illness 42 year old female with PMH of ETOH. Pres[facility] ED on 7/27 with acute left side weakness. Given TPA. CTA with Right M2 Occlusion. [facility] completed on 7/28. Pt intubated from 7/27-7/29 in am. 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     PT Comments    Pt showing improved mobility as she required less assist for ambulation this session and negotiated steps with min guard. Pt with slow guarded gait and mild instability noted with high level balance activities, however no overt LOB noted. Will continue to follow acutely.     Follow Up Recommendations  No PT follow up;Supervision - Intermittent     Equipment Recommendations  None recommended by PT    Recommendations for Other Services       Precautions / Restrictions Precautions Precautions: Fall Restrictions Weight Bearing Restrictions: No    Mobility  Bed Mobility               General bed mobility comments: in chair on arrival  Transfers Overall transfer level: Needs assistance   Transfers: Sit to/from Stand Sit to Stand: Min guard         General transfer comment: min guard for safety  Ambulation/Gait Ambulation/Gait assistance: Min guard Gait Distance (Feet): 325 Feet Assistive device: None Gait Pattern/deviations: Step-through pattern;Decreased stride length;Drifts right/left;Narrow base of support Gait velocity: decreased   General Gait Details: mild instability noted during ambulation, no LOB or staggering with high level balance activities   Stairs Stairs: Yes Stairs assistance: Min guard Stair Management: One rail Left;Alternating pattern;Step to pattern;Forwards Number of Stairs: 7 General stair comments: Step through pattern to  ascend, step to pattern to descend. Min guard for safety   Wheelchair Mobility    Modified Rankin (Stroke Patients Only) Modified Rankin (Stroke Patients Only) Pre-Morbid Rankin Score: No symptoms Modified Rankin: Moderate disability     Balance Overall balance assessment: Needs assistance Sitting-balance support: Feet supported Sitting balance-Leahy Scale: Good     Standing balance support: No upper extremity supported Standing balance-Leahy Scale: Fair Standing balance comment: able to static stand, some increased sway.             High level balance activites: Turns;Sudden stops;Head turns;Other (comment)(speed up/slow down) High Level Balance Comments: modest instability with higer level tasks            Cognition Arousal/Alertness: Awake/alert Behavior During Therapy: WFL for tasks assessed/performed Overall Cognitive Status: Within Functional Limits for tasks assessed                                        Exercises      General Comments        Pertinent Vitals/Pain Pain Assessment: Faces Faces Pain Scale: No hurt    Home Living                      Prior Function            PT Goals (current goals can now be found in the care plan section) Acute Rehab PT Goals Patient Stated Goal: to go home  PT Goal Formulation: With patient Time For Goal Achievement: 08/31/17 Potential to Achieve Goals: Good Progress towards  PT goals: Progressing toward goals    Frequency    Min 4X/week      PT Plan Current plan remains appropriate    Co-evaluation              AM-PAC PT "6 Clicks" Daily Activity  Outcome Measure  Difficulty turning over in bed (including adjusting bedclothes, sheets and blankets)?: A Little Difficulty moving from lying on back to sitting on the side of the bed? : A Little Difficulty sitting down on and standing up from a chair with arms (e.g., wheelchair, bedside commode, etc,.)?: Unable Help  needed moving to and from a bed to chair (including a wheelchair)?: A Little Help needed walking in hospital room?: A Little Help needed climbing 3-5 steps with a railing? : A Little 6 Click Score: 16    End of Session Equipment Utilized During Treatment: Gait belt Activity Tolerance: Patient tolerated treatment well Patient left: in chair;with call bell/phone within reach;with family/visitor present Nurse Communication: Mobility status PT Visit Diagnosis: Unsteadiness on feet (R26.81);Difficulty in walking, not elsewhere classified (R26.2);Other symptoms and signs involving the nervous system (R29.898)     Time: 1141-1150 PT Time Calculation (min) (ACUTE ONLY): 9 min  Charges:  $Gait Training: 8-22 mins                     Kallie Locks, [place] Pager [phone] Acute Rehab   Sheral Apley 08/18/2017, 11:58 AM

## 2017-08-18 NOTE — Progress Notes (Signed)
Initial Nutrition Assessment  DOCUMENTATION CODES:   Obesity unspecified  INTERVENTION:  Continue Ensure Enlive po BID, each supplement provides 350 kcal and 20 grams of protein   NUTRITION DIAGNOSIS:   Inadequate oral intake related to decreased appetite as evidenced by per patient/family report.  GOAL:   Patient will meet greater than or equal to 90% of their needs  MONITOR:   PO intake, Supplement acceptance  REASON FOR ASSESSMENT:   Malnutrition Screening Tool    ASSESSMENT:   [name] is a 42 y.o. female with no significant past medical history presenting with left sided weakness. Found to have R MCA embolic stroke. She received  IV TPA [facility]rology [facility]. [facility]  Extubated 7/29  Spoke with Stacie Marshall at bedside. She reports 1 month of decreased PO intake related to how hot it has been. States she drinks a lot more water and is not as hungry. Patient also reports a 30 pound weight loss over 3-4 months related to the heat. She is unsure of usual body weight, but this wound indicate a severe 13% weight loss for timeframe based on current weight.  PO intake usual consists of coffee and bread for breakfast, rice, chicken, beans and sometimes pasta for lunch, and "[place] food," or potatoes with chicken for dinner. She states she has been eating smaller portions with the heat. NFPE WNL. Does not appear to be malnourished at this time.  She reports good appetite today, had grilled chicken, mashed potatoes, and carrots for lunch. Received ensure but said it was too early for her to drink it. Will switch to the afternoon/evening time.  Medications reviewed and include:  Iron, Folic Acid, MVI, B12, Thiamine  Labs reviewed    NUTRITION - FOCUSED PHYSICAL EXAM:    Most Recent Value  Orbital Region  No depletion  Upper Arm Region  No depletion  Thoracic and Lumbar Region  No depletion  Buccal Region  No  depletion  Temple Region  No depletion  Clavicle Bone Region  No depletion  Clavicle and Acromion Bone Region  No depletion  Scapular Bone Region  No depletion  Dorsal Hand  No depletion  Patellar Region  No depletion  Anterior Thigh Region  No depletion  Posterior Calf Region  No depletion  Edema (RD Assessment)  None  Hair  Reviewed  Eyes  Reviewed  Mouth  Reviewed  Skin  Reviewed  Nails  Reviewed       Diet Order:   Diet Order           Diet regular Room service appropriate? Yes; Fluid consistency: Thin  Diet effective now          EDUCATION NEEDS:   No education needs have been identified at this time  Skin:  Skin Assessment: Skin Integrity Issues: Skin Integrity Issues:: Incisions Incisions: Closed to R groin  Last BM:  08/16/2017  Height:   Ht Readings from Last 1 Encounters:  08/15/17 5' (1.524 m)    Weight:   Wt Readings from Last 1 Encounters:  08/17/17 193 lb 5.5 oz (87.7 kg)    Ideal Body Weight:  45.45 kg  BMI:  Body mass index is 37.76 kg/m.  Estimated Nutritional Needs:   Kcal:  1750-1900 calories  Protein:  88-100 grams  Fluid:  1.75-1.9L    Dionne AnoWilliam M. Altheia Shafran, MS, RD LDN Inpatient Clinical Dietitian Pager [phone]

## 2017-08-19 NOTE — Progress Notes (Signed)
Consent signed for TEE in chart.  Sim BoastHavy, RN

## 2017-08-19 NOTE — Progress Notes (Addendum)
STROKE TEAM PROGRESS NOTE   SUBJECTIVE (INTERVAL HISTORY) Her son is at the bedside. She is setting in the bed, getting ready to eat lunch. Therapy no longer recommends HH followup given pt improvement. CM has set her up with the [facility] for follow up. Patient without complaints. Looking forward to going home after TEE tomorrow am.  OBJECTIVE CBC:  Recent Labs  Lab 08/16/17 0346  08/17/17 0144  08/18/17 0136 08/18/17 0811  WBC 10.1  --  11.3*  --   --  11.3*  NEUTROABS 7.0  --  8.2*  --   --   --   HGB 5.9*   < > 8.4*   < > 8.3* 8.9*  HCT 21.7*   < > 28.1*   < > 28.2* 29.9*  MCV 61.6*  --  65.8*  --   --  65.9*  PLT 382  --  302  --   --  356   < > = values in this interval not displayed.    Basic Metabolic Panel:  Recent Labs  Lab 08/16/17 1449 08/17/17 0144 08/18/17 0811  NA  --  138 137  K  --  3.2* 3.6  CL  --  109 105  CO2  --  22 21*  GLUCOSE  --  89 88  BUN  --  <5* <5*  CREATININE  --  0.48 0.45  CALCIUM  --  8.4* 9.1  MG 2.0 2.0  --     Lipid Panel:     Component Value Date/Time   CHOL 127 08/15/2017 0729   TRIG 208 (H) 08/15/2017 0729   TRIG 200 (H) 08/15/2017 0729   HDL 23 (L) 08/15/2017 0729   CHOLHDL 5.5 08/15/2017 0729   VLDL 42 (H) 08/15/2017 0729   LDLCALC 62 08/15/2017 0729   HgbA1c:  Lab Results  Component Value Date   HGBA1C 5.8 (H) 08/15/2017   Urine Drug Screen:     Component Value Date/Time   LABOPIA NONE DETECTED 08/15/2017 1425   COCAINSCRNUR NONE DETECTED 08/15/2017 1425   LABBENZ POSITIVE (A) 08/15/2017 1425   AMPHETMU NONE DETECTED 08/15/2017 1425   THCU NONE DETECTED 08/15/2017 1425   LABBARB NONE DETECTED 08/15/2017 1425     IMAGING CXR 08/15/2017 Tubes and lines as described above. Patchy left basilar atelectasis.  MRI Brain WO Contrast  08/16/2017 Acute small to moderate RIGHT frontoparietal/MCA territory infarct with petechial hemorrhage.  CT Abdomen and Pelvis with Contrast 08/16/2017 1. No  retroperitoneal hematoma. 2. Small pleural effusions with associated atelectasis. 3. Unchanged mild hypoattenuation of the interpolar right kidney. Correlate with urinalysis.  IR Thrombectomy 08/15/2017 S/P rt common carotid arteriogram followed by complete revascularization of occluded Rt MCA dominant inf division with x 1 pass with embotrap 5 mmm x 53m device achieving a TICI 3 reperfusion  Transthoracic Echocardiogram 08/16/2017 Left ventricle: The cavity size was normal. Systolic function was normal. The estimated ejection fraction was in the range of 60% to 65%. Wall motion was normal; there were no regional wall motion abnormalities. Left ventricular diastolic function parameters were normal.  CXR 08/17/2017 Left base atelectasis. Borderline heart size, vascular congestion.  LE Venous Dopplers - no DVT  CUS - Bilateral: 1-39% ICA stenosis. Vertebral artery flow is antegrade.  TEE pending Friday   PHYSICAL EXAM Vitals:   08/19/17 0045 08/19/17 0512 08/19/17 0830 08/19/17 1202  BP: 104/69 116/72 112/72 117/74  Pulse: 79 76 75 72  Resp: _0 Temp: 98.4  F (36.9 C) 98.7 F (37.1 C) 98.1 F (36.7 C) 98.4 F (36.9 C)  TempSrc: Oral Oral Oral Oral  SpO2: 99% 96% 98% 97%  Weight:      Height:       General - Well nourished, well developed, up in chair  Cardiovascular - Regular rate and rhythm  Neuro - awake, alert and oriented x 3. Speech clear. No aphasia. Can follow all commands. No gaze preference, able to cross midline.  Can count fingers all fiedls. Face symmetric. Tongue midline.  Left upper extremity 4/5.  Right upper extremity 5/5.  Bilateral lower extremity 5/5.  DTR 2 +, no Babinski.  Sensory symmetrical.  Coordination intact except for LUE d/t weakness. HRR. Breath sounds clear.   ASSESSMENT/PLAN [name] is a 42 y.o. female with no significant past medical history presenting with left sided weakness. She received  IV TPA per  tele-neurology at [facility]. She underwent thrombectomy Rt MCA dominant inf division.  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  Resultant Left arm weakness  MRI head - Acute small to moderate RIGHT frontoparietal/MCA territory infarct with petechial hemorrhage.  CTA head and neck right M2 occlusion  IR right M2 occlusion status post TICI 3 reperfusion  LE venous Doppler negative for DVT  2D Echo - EF 60 - 65%. No cardiac source of emboli identified.  Carotid Doppler -unremarkable  TEE pending Friday with anesthesia given ETOH hx   Recommend LP to assess for vasculititis. Dr. Erlinda Hong discussed with her. She refused.  Hypercoagulable work-up ESR 25, CRP 5.7, other test normal or pending   LDL - 62  HgbA1c - 5.8  VTE prophylaxis - SCDs  No antithrombotic prior to admission, now on aspirin 372m  Patient counseled to be compliant with her antithrombotic medications  Ongoing aggressive stroke risk factor management  Therapy recommendations:  HH OT initially recommended, now improved and no therapy needs at d/c   Disposition:  Pending. Medically ready for d/c once TEE done  Acute Blood loss Anemia  Hx Iron deficiency anemia  CT showed no retroperitoneal hematoma  Hb 8.3->5.9->PRBC->8.3->8.9  Low iron and ferritin  On iron supplement  Alcoholism  6-10 beers on the weekends  On FA/MVI/B1  No signs of DTs. On CIWA protocol  Tobacco abuse  Current smoker  Smoking cessation counseling provided  Pt is willing to quit  Kidney versus bladder infection Leukocytosis  Takes Keflex as outpatient for the last 3 days  Continue Keflex for 10 days total (adjusted end date on order)  UA mod leukocytes, many bacteria, WBC 6-10  CT Abdomen and Pelvis Unchanged mild hypoattenuation of the interpolar right kidney.   WBC 11.3  B12 deficiency  B12 156  B12 supplement daily with IM  injection  Change to p.o. once to access  Hyperlipidemia  Lipid lowering medication PTA:  none  LDL 62, goal < 70  Current lipid lowering medication:  Lipitor 10  Continue statin at discharge  Other Stroke Risk Factors  Obesity, Body mass index is 37.76 kg/m., recommend weight loss, diet and exercise as appropriate   UDS positive for benzos. ETOH not performed  Denies OCP use   No family hx clotting d/o  Other Active Problems  Respiratory failure d/t stroke, resolved  Hypokalemia, replaced, 3.2->3.6  Stable  - continues to improved L HP. For TEE tomorrow, then home  Hospital day # [place]  MSN, APRN, ANVP-BC, AGPCNP-BC Advanced Practice Stroke Nurse [place] for Schedule & Pager information 08/19/2017 3:01 PM   ATTENDING NOTE: I reviewed above note and agree with the assessment and plan. I have made any additions or clarifications directly to the above note. Pt was seen and examined.   Overnight no acute event.  Neuro stable with continued improvement.  Left arm muscle strength much improved.  PT OT now recommend no PT/OT work-up only with 24-hour supervision.  Pending TEE tomorrow.  Plan to perform LP this afternoon, however patient declined.  Hypercoagulable work-up nonrevealing. Tele for the last 24 hours did not show afib.   Rosalin Hawking, MD PhD Stroke Neurology 08/19/2017 9:32 PM     To contact Stroke Continuity provider, please refer to [email or web address] After hours, contact General Neurology

## 2017-08-19 NOTE — Progress Notes (Signed)
Physical Therapy Treatment Patient Details Name: Stacie Marshall MRN: [id number] DOB: 06-16-1975 Today's Date: 08/19/2017    History of Present Illness 42 year old female with PMH of ETOH. Presents to [facility] ED on 7/27 with acute left side weakness. Given TPA. CTA with Right M2 Occlusion. Transferred to [facility] for Thrombectomy completed on 7/28. Pt intubated from 7/27-7/29 in am. 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     PT Comments    Patient continues to make progress toward PT goals. Current plan remains appropriate.    Follow Up Recommendations  No PT follow up;Supervision - Intermittent     Equipment Recommendations  None recommended by PT    Recommendations for Other Services       Precautions / Restrictions Precautions Precautions: Fall Restrictions Weight Bearing Restrictions: No    Mobility  Bed Mobility Overal bed mobility: Independent                Transfers Overall transfer level: Needs assistance   Transfers: Sit to/from Stand Sit to Stand: Supervision         General transfer comment: supervision for safety  Ambulation/Gait Ambulation/Gait assistance: Supervision Gait Distance (Feet): 400 Feet Assistive device: None Gait Pattern/deviations: Step-through pattern;Decreased stride length Gait velocity: decreased   General Gait Details: pt with slow, steady gait; cues for cadence and pt able to increased gait velocity without increased gait deviations    Stairs             Wheelchair Mobility    Modified Rankin (Stroke Patients Only) Modified Rankin (Stroke Patients Only) Pre-Morbid Rankin Score: No symptoms Modified Rankin: Slight disability     Balance Overall balance assessment: Needs assistance Sitting-balance support: Feet supported Sitting balance-Leahy Scale: Good     Standing balance support: No upper extremity supported Standing  balance-Leahy Scale: Fair               High level balance activites: Direction changes;Turns;Sudden stops;Head turns;Other (comment)(chang in gait speed) High Level Balance Comments: pt without significant gait deviations with high level balance activities            Cognition Arousal/Alertness: Awake/alert Behavior During Therapy: WFL for tasks assessed/performed Overall Cognitive Status: Within Functional Limits for tasks assessed                                        Exercises Other Exercises Other Exercises: bilat tandem stance  Other Exercises: bilat single leg stance    General Comments        Pertinent Vitals/Pain Pain Assessment: No/denies pain    Home Living                      Prior Function            PT Goals (current goals can now be found in the care plan section) Acute Rehab PT Goals Patient Stated Goal: to go home  PT Goal Formulation: With patient Time For Goal Achievement: 08/31/17 Potential to Achieve Goals: Good Progress towards PT goals: Progressing toward goals    Frequency    Min 4X/week      PT Plan Current plan remains appropriate    Co-evaluation              AM-PAC PT "6 Clicks" Daily Activity  Outcome Measure  Difficulty turning over in  bed (including adjusting bedclothes, sheets and blankets)?: A Little Difficulty moving from lying on back to sitting on the side of the bed? : A Little Difficulty sitting down on and standing up from a chair with arms (e.g., wheelchair, bedside commode, etc,.)?: Unable Help needed moving to and from a bed to chair (including a wheelchair)?: A Little Help needed walking in hospital room?: A Little Help needed climbing 3-5 steps with a railing? : A Little 6 Click Score: 16    End of Session Equipment Utilized During Treatment: Gait belt Activity Tolerance: Patient tolerated treatment well Patient left: in chair;with call bell/phone within reach;with  family/visitor present Nurse Communication: Mobility status PT Visit Diagnosis: Unsteadiness on feet (R26.81);Difficulty in walking, not elsewhere classified (R26.2);Other symptoms and signs involving the nervous system (Q46.962(R29.898)     Time: [id number] PT Time Calculation (min) (ACUTE ONLY): 21 min  Charges:  $Gait Training: 8-22 mins                     Erline LevineKellyn Raymone Pembroke, PTA Pager: [phone]     Stacie Marshall 08/19/2017, 8:56 AM

## 2017-08-19 NOTE — H&P (View-Only) (Signed)
STROKE TEAM PROGRESS NOTE   SUBJECTIVE (INTERVAL HISTORY) Her son is at the bedside. She is setting in the bed, getting ready to eat lunch. Therapy no longer recommends HH followup given pt improvement. CM has set her up with the [facility] for follow up. Patient without complaints. Looking forward to going home after TEE tomorrow am.  OBJECTIVE CBC:  Recent Labs  Lab 08/16/17 0346  08/17/17 0144  08/18/17 0136 08/18/17 0811  WBC 10.1  --  11.3*  --   --  11.3*  NEUTROABS 7.0  --  8.2*  --   --   --   HGB 5.9*   < > 8.4*   < > 8.3* 8.9*  HCT 21.7*   < > 28.1*   < > 28.2* 29.9*  MCV 61.6*  --  65.8*  --   --  65.9*  PLT 382  --  302  --   --  356   < > = values in this interval not displayed.    Basic Metabolic Panel:  Recent Labs  Lab 08/16/17 1449 08/17/17 0144 08/18/17 0811  NA  --  138 137  K  --  3.2* 3.6  CL  --  109 105  CO2  --  22 21*  GLUCOSE  --  89 88  BUN  --  <5* <5*  CREATININE  --  0.48 0.45  CALCIUM  --  8.4* 9.1  MG 2.0 2.0  --     Lipid Panel:     Component Value Date/Time   CHOL 127 08/15/2017 0729   TRIG 208 (H) 08/15/2017 0729   TRIG 200 (H) 08/15/2017 0729   HDL 23 (L) 08/15/2017 0729   CHOLHDL 5.5 08/15/2017 0729   VLDL 42 (H) 08/15/2017 0729   LDLCALC 62 08/15/2017 0729   HgbA1c:  Lab Results  Component Value Date   HGBA1C 5.8 (H) 08/15/2017   Urine Drug Screen:     Component Value Date/Time   LABOPIA NONE DETECTED 08/15/2017 1425   COCAINSCRNUR NONE DETECTED 08/15/2017 1425   LABBENZ POSITIVE (A) 08/15/2017 1425   AMPHETMU NONE DETECTED 08/15/2017 1425   THCU NONE DETECTED 08/15/2017 1425   LABBARB NONE DETECTED 08/15/2017 1425     IMAGING CXR 08/15/2017 Tubes and lines as described above. Patchy left basilar atelectasis.  MRI Brain WO Contrast  08/16/2017 Acute small to moderate RIGHT frontoparietal/MCA territory infarct with petechial hemorrhage.  CT Abdomen and Pelvis with Contrast 08/16/2017 1. No  retroperitoneal hematoma. 2. Small pleural effusions with associated atelectasis. 3. Unchanged mild hypoattenuation of the interpolar right kidney. Correlate with urinalysis.  IR Thrombectomy 08/15/2017 S/P rt common carotid arteriogram followed by complete revascularization of occluded Rt MCA dominant inf division with x 1 pass with embotrap 5 mmm x 53m device achieving a TICI 3 reperfusion  Transthoracic Echocardiogram 08/16/2017 Left ventricle: The cavity size was normal. Systolic function was normal. The estimated ejection fraction was in the range of 60% to 65%. Wall motion was normal; there were no regional wall motion abnormalities. Left ventricular diastolic function parameters were normal.  CXR 08/17/2017 Left base atelectasis. Borderline heart size, vascular congestion.  LE Venous Dopplers - no DVT  CUS - Bilateral: 1-39% ICA stenosis. Vertebral artery flow is antegrade.  TEE pending Friday   PHYSICAL EXAM Vitals:   08/19/17 0045 08/19/17 0512 08/19/17 0830 08/19/17 1202  BP: 104/69 116/72 112/72 117/74  Pulse: 79 76 75 72  Resp: _0 Temp: 98.4  F (36.9 C) 98.7 F (37.1 C) 98.1 F (36.7 C) 98.4 F (36.9 C)  TempSrc: Oral Oral Oral Oral  SpO2: 99% 96% 98% 97%  Weight:      Height:       General - Well nourished, well developed, up in chair  Cardiovascular - Regular rate and rhythm  Neuro - awake, alert and oriented x 3. Speech clear. No aphasia. Can follow all commands. No gaze preference, able to cross midline.  Can count fingers all fiedls. Face symmetric. Tongue midline.  Left upper extremity 4/5.  Right upper extremity 5/5.  Bilateral lower extremity 5/5.  DTR 2 +, no Babinski.  Sensory symmetrical.  Coordination intact except for LUE d/t weakness. HRR. Breath sounds clear.   ASSESSMENT/PLAN Stacie Marshall is a 42 y.o. female with no significant past medical history presenting with left sided weakness. She received  IV TPA per  tele-neurology at [facility]. She underwent thrombectomy Rt MCA dominant inf division.  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  Resultant Left arm weakness  MRI head - Acute small to moderate RIGHT frontoparietal/MCA territory infarct with petechial hemorrhage.  CTA head and neck right M2 occlusion  IR right M2 occlusion status post TICI 3 reperfusion  LE venous Doppler negative for DVT  2D Echo - EF 60 - 65%. No cardiac source of emboli identified.  Carotid Doppler -unremarkable  TEE pending Friday with anesthesia given ETOH hx   Recommend LP to assess for vasculititis. Dr. Erlinda Hong discussed with her. She refused.  Hypercoagulable work-up ESR 25, CRP 5.7, other test normal or pending   LDL - 62  HgbA1c - 5.8  VTE prophylaxis - SCDs  No antithrombotic prior to admission, now on aspirin 372m  Patient counseled to be compliant with her antithrombotic medications  Ongoing aggressive stroke risk factor management  Therapy recommendations:  HH OT initially recommended, now improved and no therapy needs at d/c   Disposition:  Pending. Medically ready for d/c once TEE done  Acute Blood loss Anemia  Hx Iron deficiency anemia  CT showed no retroperitoneal hematoma  Hb 8.3->5.9->PRBC->8.3->8.9  Low iron and ferritin  On iron supplement  Alcoholism  6-10 beers on the weekends  On FA/MVI/B1  No signs of DTs. On CIWA protocol  Tobacco abuse  Current smoker  Smoking cessation counseling provided  Pt is willing to quit  Kidney versus bladder infection Leukocytosis  Takes Keflex as outpatient for the last 3 days  Continue Keflex for 10 days total (adjusted end date on order)  UA mod leukocytes, many bacteria, WBC 6-10  CT Abdomen and Pelvis Unchanged mild hypoattenuation of the interpolar right kidney.   WBC 11.3  B12 deficiency  B12 156  B12 supplement daily with IM  injection  Change to p.o. once to access  Hyperlipidemia  Lipid lowering medication PTA:  none  LDL 62, goal < 70  Current lipid lowering medication:  Lipitor 10  Continue statin at discharge  Other Stroke Risk Factors  Obesity, Body mass index is 37.76 kg/m., recommend weight loss, diet and exercise as appropriate   UDS positive for benzos. ETOH not performed  Denies OCP use   No family hx clotting d/o  Other Active Problems  Respiratory failure d/t stroke, resolved  Hypokalemia, replaced, 3.2->3.6  Stable  - continues to improved L HP. For TEE tomorrow, then home  Hospital day # [place]  MSN, APRN, ANVP-BC, AGPCNP-BC Advanced Practice Stroke Nurse [place] for Schedule & Pager information 08/19/2017 3:01 PM   ATTENDING NOTE: I reviewed above note and agree with the assessment and plan. I have made any additions or clarifications directly to the above note. Pt was seen and examined.   Overnight no acute event.  Neuro stable with continued improvement.  Left arm muscle strength much improved.  PT OT now recommend no PT/OT work-up only with 24-hour supervision.  Pending TEE tomorrow.  Plan to perform LP this afternoon, however patient declined.  Hypercoagulable work-up nonrevealing. Tele for the last 24 hours did not show afib.   Rosalin Hawking, MD PhD Stroke Neurology 08/19/2017 9:32 PM     To contact Stroke Continuity provider, please refer to [email or web address] After hours, contact General Neurology

## 2017-08-20 ENCOUNTER — Encounter (HOSPITAL_COMMUNITY)
Admission: AD | Disposition: A | Discharge status: Home / Self Care | Payer: Self-pay | Source: Other Acute Inpatient Hospital | Attending: Neurology

## 2017-08-20 ENCOUNTER — Encounter (HOSPITAL_COMMUNITY): Payer: Self-pay

## 2017-08-20 ENCOUNTER — Inpatient Hospital Stay (HOSPITAL_COMMUNITY): Payer: Medicaid Other | Admitting: Anesthesiology

## 2017-08-20 ENCOUNTER — Inpatient Hospital Stay (HOSPITAL_COMMUNITY): Payer: Medicaid Other

## 2017-08-20 DIAGNOSIS — D72829 Elevated white blood cell count, unspecified: Secondary | ICD-10-CM

## 2017-08-20 DIAGNOSIS — D500 Iron deficiency anemia secondary to blood loss (chronic): Secondary | ICD-10-CM

## 2017-08-20 DIAGNOSIS — E669 Obesity, unspecified: Secondary | ICD-10-CM

## 2017-08-20 DIAGNOSIS — F17200 Nicotine dependence, unspecified, uncomplicated: Secondary | ICD-10-CM

## 2017-08-20 DIAGNOSIS — D62 Acute posthemorrhagic anemia: Secondary | ICD-10-CM

## 2017-08-20 DIAGNOSIS — I6389 Other cerebral infarction: Secondary | ICD-10-CM

## 2017-08-20 DIAGNOSIS — I63511 Cerebral infarction due to unspecified occlusion or stenosis of right middle cerebral artery: Secondary | ICD-10-CM

## 2017-08-20 DIAGNOSIS — F1010 Alcohol abuse, uncomplicated: Secondary | ICD-10-CM

## 2017-08-20 DIAGNOSIS — N390 Urinary tract infection, site not specified: Secondary | ICD-10-CM

## 2017-08-20 DIAGNOSIS — D509 Iron deficiency anemia, unspecified: Secondary | ICD-10-CM

## 2017-08-20 HISTORY — DX: Urinary tract infection, site not specified: N39.0

## 2017-08-20 HISTORY — DX: Iron deficiency anemia, unspecified: D50.9

## 2017-08-20 HISTORY — DX: Elevated white blood cell count, unspecified: D72.829

## 2017-08-20 HISTORY — PX: TEE WITHOUT CARDIOVERSION: SHX5443

## 2017-08-20 HISTORY — DX: Acute posthemorrhagic anemia: D62

## 2017-08-20 LAB — FACTOR 5 LEIDEN

## 2017-08-20 LAB — POCT PREGNANCY, URINE: PREG TEST UR: NEGATIVE

## 2017-08-20 SURGERY — ECHOCARDIOGRAM, TRANSESOPHAGEAL
Anesthesia: Monitor Anesthesia Care

## 2017-08-20 MED ORDER — ADULT MULTIVITAMIN W/MINERALS CH: 1.0000 | ORAL_TABLET | Freq: Every day | ORAL | 2 refills | Status: AC

## 2017-08-20 MED ORDER — CEPHALEXIN 500 MG PO CAPS: 500.0000 mg | ORAL_CAPSULE | Freq: Three times a day (TID) | ORAL | 0 refills | Status: DC

## 2017-08-20 MED ORDER — ADULT MULTIVITAMIN W/MINERALS CH: 1.0000 | ORAL_TABLET | Freq: Every day | ORAL | 2 refills | Status: DC

## 2017-08-20 MED ORDER — CLOPIDOGREL BISULFATE 75 MG PO TABS: 75.0000 mg | ORAL_TABLET | Freq: Every day | ORAL | 0 refills | Status: DC

## 2017-08-20 MED ORDER — ATORVASTATIN CALCIUM 10 MG PO TABS: 10.0000 mg | ORAL_TABLET | Freq: Every day | ORAL | 2 refills | Status: DC

## 2017-08-20 MED ORDER — LACTATED RINGERS IV SOLN
INTRAVENOUS | Status: DC
  Administered 2017-08-20: 11:00:00 via INTRAVENOUS
  Administered 2017-08-20: 1000 mL via INTRAVENOUS

## 2017-08-20 MED ORDER — PROPOFOL 500 MG/50ML IV EMUL
INTRAVENOUS | Status: DC | PRN
  Administered 2017-08-20: 25 ug/kg/min via INTRAVENOUS

## 2017-08-20 MED ORDER — THIAMINE HCL 100 MG PO TABS: 100.0000 mg | ORAL_TABLET | Freq: Every day | ORAL | 2 refills | Status: DC

## 2017-08-20 MED ORDER — FERROUS SULFATE 325 (65 FE) MG PO TABS: 325.0000 mg | ORAL_TABLET | Freq: Three times a day (TID) | ORAL | 2 refills | Status: AC

## 2017-08-20 MED ORDER — PROPOFOL 10 MG/ML IV BOLUS
INTRAVENOUS | Status: DC | PRN
  Administered 2017-08-20 (×2): 20 mg via INTRAVENOUS
  Administered 2017-08-20: 10 mg via INTRAVENOUS
  Administered 2017-08-20: 20 mg via INTRAVENOUS

## 2017-08-20 MED ORDER — MIDAZOLAM HCL 2 MG/2ML IJ SOLN
INTRAMUSCULAR | Status: AC
  Filled 2017-08-20: qty 2

## 2017-08-20 MED ORDER — FOLIC ACID 1 MG PO TABS: 1.0000 mg | ORAL_TABLET | Freq: Every day | ORAL | 2 refills | Status: DC

## 2017-08-20 MED ORDER — FERROUS SULFATE 325 (65 FE) MG PO TABS: 325.0000 mg | ORAL_TABLET | Freq: Three times a day (TID) | ORAL | 2 refills | Status: DC

## 2017-08-20 MED ORDER — CYANOCOBALAMIN 1000 MCG PO TABS: 1000.0000 ug | ORAL_TABLET | Freq: Every day | ORAL | 2 refills | Status: DC

## 2017-08-20 MED ORDER — CLOPIDOGREL BISULFATE 75 MG PO TABS: 75.0000 mg | ORAL_TABLET | Freq: Every day | ORAL | Status: DC

## 2017-08-20 MED ORDER — BUTAMBEN-TETRACAINE-BENZOCAINE 2-2-14 % EX AERO
INHALATION_SPRAY | CUTANEOUS | Status: DC | PRN
  Administered 2017-08-20: 1 via TOPICAL

## 2017-08-20 MED ORDER — LIDOCAINE HCL (CARDIAC) PF 100 MG/5ML IV SOSY
PREFILLED_SYRINGE | INTRAVENOUS | Status: DC | PRN
  Administered 2017-08-20: 30 mg via INTRAVENOUS

## 2017-08-20 MED ORDER — ASPIRIN 81 MG PO TBEC: 81.0000 mg | DELAYED_RELEASE_TABLET | Freq: Every day | ORAL | 2 refills | Status: AC

## 2017-08-20 MED ORDER — MIDAZOLAM HCL 5 MG/5ML IJ SOLN
INTRAMUSCULAR | Status: DC | PRN
  Administered 2017-08-20: 1 mg via INTRAVENOUS

## 2017-08-20 MED ORDER — CYANOCOBALAMIN 1000 MCG PO TABS: 1000.0000 ug | ORAL_TABLET | Freq: Every day | ORAL | 2 refills | Status: AC

## 2017-08-20 NOTE — Progress Notes (Signed)
Nsg Discharge Note  Admit Date:  08/15/2017 Discharge date: 08/20/2017   Lizabeth LeydenMaury Castillo Reynoso to be D/C'd Home per MD order.  AVS completed.  Copy for chart, and copy for patient signed, and dated. Patient/caregiver able to verbalize understanding.  Discharge Medication: Allergies as of 08/20/2017   No Known Allergies     Medication List    TAKE these medications   aspirin 81 MG EC tablet Take 1 tablet (81 mg total) by mouth daily. Start taking on:  08/21/2017   atorvastatin 10 MG tablet Commonly known as:  LIPITOR Take 1 tablet (10 mg total) by mouth daily at 6 PM.   cephALEXin 500 MG capsule Commonly known as:  KEFLEX Take 1 capsule (500 mg total) by mouth every 8 (eight) hours. This prescription was given to you prior to hospital admission. You took this medicine while you were in the hospital. Take 2 more tablets today 8/2 every 8 hours and 3 tablets tomorrow every 8 hrs then stop What changed:  additional instructions   clopidogrel 75 MG tablet Commonly known as:  PLAVIX Take 1 tablet (75 mg total) by mouth daily. Start taking on:  08/21/2017   cyanocobalamin 1000 MCG tablet Take 1 tablet (1,000 mcg total) by mouth daily. Start taking on:  08/21/2017   ferrous sulfate 325 (65 FE) MG tablet Take 1 tablet (325 mg total) by mouth 3 (three) times daily with meals.   folic acid 1 MG tablet Commonly known as:  FOLVITE Take 1 tablet (1 mg total) by mouth daily. Start taking on:  08/21/2017   multivitamin with minerals Tabs tablet Take 1 tablet by mouth daily. Start taking on:  08/21/2017   thiamine 100 MG tablet Take 1 tablet (100 mg total) by mouth daily. Start taking on:  08/21/2017       Discharge Assessment: Vitals:   08/20/17 1250 08/20/17 1325  BP: 107/87 128/85  Pulse: (!) 59 63  Resp: 16 16  Temp:  97.8 F (36.6 C)  SpO2: 100% 100%   Skin clean, dry and intact without evidence of skin break down, no evidence of skin tears noted. IV catheter discontinued  intact. Site without signs and symptoms of complications - no redness or edema noted at insertion site, patient denies c/o pain - only slight tenderness at site.  Dressing with slight pressure applied.  D/c Instructions-Education: Discharge instructions given to patient/family with verbalized understanding. D/c education completed with patient/family including follow up instructions, medication list, d/c activities limitations if indicated, with other d/c instructions as indicated by MD - patient able to verbalize understanding, all questions fully answered. Patient instructed to return to ED, call 911, or call MD for any changes in condition.  Patient escorted via WC, and D/C home via private auto.  Adair LaundryElizabeth A Aeralyn Barna, RN 08/20/2017 3:54 PM

## 2017-08-20 NOTE — Discharge Summary (Addendum)
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 HTN (hypertension)    Past Surgical History:  Procedure Laterality Date  . IR CT HEAD LTD  08/15/2017  . IR PERCUTANEOUS ART THROMBECTOMY/INFUSION INTRACRANIAL INC DIAG ANGIO  08/15/2017  . RADIOLOGY WITH ANESTHESIA N/A 08/15/2017   Procedure: RADIOLOGY WITH ANESTHESIA;  Surgeon: Luanne Bras, MD;  Location: [place];  Service: Radiology;  Laterality: N/A;    Allergies as of 08/20/2017   No Known Allergies     Medication List    TAKE these medications   aspirin 81 MG EC tablet Take 1 tablet (81 mg total) by mouth daily. Start taking on:  08/21/2017   atorvastatin 10 MG tablet Commonly known as:  LIPITOR Take 1 tablet (10 mg total) by mouth daily at 6 PM.   cephALEXin 500 MG capsule Commonly known as:  KEFLEX Take 1 capsule (500 mg total) by mouth every 8 (eight) hours. This prescription was given to you prior to hospital admission. You took this medicine while you were in the hospital. Take 2 more tablets today 8/2 every 8 hours and 3 tablets tomorrow every 8 hrs then stop What changed:  additional instructions   clopidogrel 75 MG tablet Commonly known as:  PLAVIX Take 1 tablet (75 mg total) by mouth daily. Start taking on:  08/21/2017   cyanocobalamin  1000 MCG tablet Take 1 tablet (1,000 mcg total) by mouth daily. Start taking on:  08/21/2017   ferrous sulfate 325 (65 FE) MG tablet Take 1 tablet (325 mg total) by mouth 3 (three) times daily with meals.   folic acid 1 MG tablet Commonly known as:  FOLVITE Take 1 tablet (1 mg total) by mouth daily. Start taking on:  08/21/2017   multivitamin with minerals Tabs tablet Take 1 tablet by mouth daily. Start taking on:  08/21/2017   thiamine 100 MG tablet Take 1 tablet (100 mg total) by mouth daily. Start taking on:  08/21/2017       LABORATORY STUDIES CBC    Component Value Date/Time   WBC 11.3 (H) 08/18/2017 0811   RBC 4.54 08/18/2017 0811   HGB 8.9 (L) 08/18/2017 0811   HCT 29.9 (L) 08/18/2017 0811   PLT 356 08/18/2017 0811   MCV 65.9 (L) 08/18/2017 0811   MCH 19.6 (L) 08/18/2017 0811   MCHC 29.8 (L) 08/18/2017 0811   RDW 27.4 (H) 08/18/2017 0811   LYMPHSABS 2.1 08/17/2017 0144   MONOABS 0.9 08/17/2017 0144   EOSABS 0.1 08/17/2017 0144   BASOSABS 0.0 08/17/2017 0144   CMP    Component Value Date/Time   NA 137 08/18/2017 0811   K 3.6 08/18/2017 0811   CL 105 08/18/2017 0811   CO2 21 (L) 08/18/2017 0811   GLUCOSE 88  08/18/2017 0811   BUN <5 (L) 08/18/2017 0811   CREATININE 0.45 08/18/2017 0811   CALCIUM 9.1 08/18/2017 0811   PROT 6.4 (L) 08/17/2017 0144   ALBUMIN 2.8 (L) 08/17/2017 0144   AST 23 08/17/2017 0144   ALT 14 08/17/2017 0144   ALKPHOS 83 08/17/2017 0144   BILITOT 0.8 08/17/2017 0144   GFRNONAA >60 08/18/2017 0811   GFRAA >60 08/18/2017 0811   COAGS Lab Results  Component Value Date   INR 1.21 08/17/2017   Lipid Panel    Component Value Date/Time   CHOL 127 08/15/2017 0729   TRIG 208 (H) 08/15/2017 0729   TRIG 200 (H) 08/15/2017 0729   HDL 23 (L) 08/15/2017 0729   CHOLHDL 5.5 08/15/2017 0729   VLDL 42 (H) 08/15/2017 0729   LDLCALC 62 08/15/2017 0729   HgbA1C  Lab Results  Component Value Date   HGBA1C 5.8 (H) 08/15/2017   Urinalysis     Component Value Date/Time   COLORURINE YELLOW 08/16/2017 1231   APPEARANCEUR HAZY (A) 08/16/2017 1231   LABSPEC 1.012 08/16/2017 1231   PHURINE 5.0 08/16/2017 1231   GLUCOSEU NEGATIVE 08/16/2017 1231   HGBUR NEGATIVE 08/16/2017 1231   BILIRUBINUR NEGATIVE 08/16/2017 1231   KETONESUR 5 (A) 08/16/2017 1231   PROTEINUR NEGATIVE 08/16/2017 1231   NITRITE NEGATIVE 08/16/2017 1231   LEUKOCYTESUR MODERATE (A) 08/16/2017 1231   Urine Drug Screen     Component Value Date/Time   LABOPIA NONE DETECTED 08/15/2017 1425   COCAINSCRNUR NONE DETECTED 08/15/2017 1425   LABBENZ POSITIVE (A) 08/15/2017 1425   AMPHETMU NONE DETECTED 08/15/2017 1425   THCU NONE DETECTED 08/15/2017 1425   LABBARB NONE DETECTED 08/15/2017 1425    Alcohol Level No results found for: [facility]   SIGNIFICANT DIAGNOSTIC STUDIES CXR 08/15/2017 Tubes and lines as described above. Patchy left basilar atelectasis.  MRI Brain WO Contrast  08/16/2017 Acute small to moderate RIGHT frontoparietal/MCA territory infarct with petechial hemorrhage.  CT Abdomen and Pelvis with Contrast 08/16/2017 1. No retroperitoneal hematoma. 2. Small pleural effusions with associated atelectasis. 3. Unchanged mild hypoattenuation of the interpolar right kidney. Correlate with urinalysis.  IR Thrombectomy 08/15/2017 S/P rt common carotid arteriogram followed by complete revascularization of occluded Rt MCA dominant inf division with x 1 pass with embotrap 5 mmm x 3m device achieving a TICI 3 reperfusion  Transthoracic Echocardiogram 08/16/2017 Left ventricle: The cavity size was normal. Systolic function was normal. The estimated ejection fraction was in the range of 60%to 65%. Wall motion was normal; there were no regional wallmotion abnormalities. Left ventricular diastolic functionparameters were normal.  CXR 08/17/2017 Left base atelectasis. Borderline heart size, vascular congestion.  LE Venous Dopplers - no DVT  CUS -  Bilateral: 1-39% ICA stenosis. Vertebral artery flow is antegrade.  TEE Left Ventrical:  Mildly reduced LV function - EF 40-45% Mitral Valve: trace MR  Aortic Valve: normal  Tricuspid Valve: normal  Pulmonic Valve: normal  Left Atrium/ Left atrial appendage: no thrombi  Atrial septum: no ASD or PFO by color or bubble study  Aorta: normal    HISTORY OF PRESENT ILLNESS Stacie Marshall a 42y.o. female with no significant past medical history who presents with acute left-sided weakness that started around 11:30 PM 08/14/2017 ([facility]KW).  She was in her normal state of health earlier tonight, but had been drinking.  She then was seen to collapse, and subsequently was noted to have left-sided weakness.  [facility]  [facility] gave IV TPA. Modified Rankin Scale: 0-Completely asymptomatic and back to baseline post- stroke  She was agitated and in order to get a CTA, she had to be intubated and therefore was intubated for transport. CTA showed  R M2 occlusion amenable to intervention. Upon arrival to [facility] NIHSS was 14. She was taken for mechanical thrombectomy where she had complete revascularization of the occlusion. She was admitted to the neuro ICU for further evaluation and treatment.    HOSPITAL COURSE Stacie Marshall is a 42 y.o. female with no significant past medical history presenting with left sided weakness. She received  IV TPA per tele-neurology at [facility]. She underwent mechanical thrombectomy Rt MCA dominant inf division with complete revascularization. Infarct felt to be embolic, though no source found. TEE neg. hypercoagulable work up pending.   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  Resultant Left arm weakness  MRI head - Acute small to moderate RIGHT frontoparietal/MCA territory infarct with petechial hemorrhage.  CTA head and  neck right M2 occlusion  IR right M2 occlusion status post TICI 3 reperfusion  LE venous Doppler negative for DVT  2D Echo - EF 60 - 65%. No cardiac source of emboli identified.  Carotid Doppler -unremarkable  TEE pending Friday with anesthesia given ETOH hx   Recommend LP to assess for vasculititis. Dr. Erlinda Hong discussed with her. She refused.  Hypercoagulable work-up ESR 25, CRP 5.7, other test normal or pending   LDL - 62  HgbA1c - 5.8  VTE prophylaxis - SCDs  No antithrombotic prior to admission, now on aspirin '325mg'$   Patient counseled to be compliant with her antithrombotic medications  Ongoing aggressive stroke risk factor management  Therapy recommendations:  HH OT initially recommended, now improved and no therapy needs at d/c   Disposition:  Pending. Medically ready for d/c once TEE done  Acute Blood loss Anemia  Hx Iron deficiency anemia  CT showed no retroperitoneal hematoma  Hb 8.3->5.9->PRBC->8.3->8.9  Low iron and ferritin  On iron supplement  Alcoholism  6-10 beers on the weekends  On FA/MVI/B1  No signs of DTs. On CIWA protocol  Tobacco abuse  Current smoker  Smoking cessation counseling provided  Pt is willing to quit  Kidney versus bladder infection Leukocytosis  Takes Keflex as outpatient for the last 3 days  Continue Keflex for 10 days total (adjusted end date on order)  UA mod leukocytes, many bacteria, WBC 6-10  CT Abdomen and Pelvis Unchanged mild hypoattenuation of the interpolar right kidney.   WBC 11.3  B12 deficiency  B12 156  B12 supplement daily with IM injection  Change to p.o. once to access  Hyperlipidemia  Lipid lowering medication PTA:  none  LDL 62, goal < 70  Current lipid lowering medication:  Lipitor 10  Continue statin at discharge  Other Stroke Risk Factors  Obesity, Body mass index is 37.76 kg/m., recommend weight loss, diet and exercise as appropriate   UDS positive for benzos.  ETOH not performed  Denies OCP use   Father has ? clotting d/o - on eliquis life-long per pt  Other Active Problems  Respiratory failure d/t stroke, resolved  Hypokalemia, replaced, 3.2->3.6  Stable  - continues to improved L HP. For TEE tomorrow, then home    DISCHARGE EXAM Blood pressure 128/85, pulse 63, temperature 97.8 F (36.6 C), temperature source Oral, resp. rate 16, height 5' (1.524 m), weight 87.5 kg (  193 lb), SpO2 100 %. General - Well nourished, well developed, in no acute distress Cardiovascular - Regular rate and rhythm Neuro - awake, alert and oriented x 3. Speech clear. No aphasia. Can follow all commands. No gaze preference, able to cross midline.  Can count fingers all fiedls. Face symmetric. Tongue midline.  Left upper extremity 5/5 proximal, 4+/5 distal.  Right upper extremity 5/5.  Bilateral lower extremity 5/5.  DTR 2 +, no Babinski.  Sensory symmetrical.  Coordination intact except for LUE d/t weakness. HRR. Breath sounds clear.  Discharge Diet   Regular thin liquids  DISCHARGE PLAN  Disposition:  home  Aspirin 81 mg and plavix 75 mg daily x 3 weeks, then aspirin alone.   Ongoing risk factor control by Primary Care Physician at time of discharge  [facility] for primary care  Follow up Dr. Estanislado Pandy in 4 weeks.  Follow-up in [facility] in 4 weeks, office to schedule an appointment.   45 minutes were spent preparing discharge.  Burnetta Sabin, MSN, APRN, ANVP-BC, AGPCNP-BC Advanced Practice Stroke Nurse [place] for Schedule & Pager information 08/20/2017 2:47 PM    ATTENDING NOTE: I reviewed above note and agree with the assessment and plan. I have made any additions or clarifications directly to the above note. Pt was seen and examined.   No acute event overnight.  Patient neurologically near normal.  Had TEE this morning, EF 40 to 45%, no PFO.  However, her EF was 60 to  65% on regular TTE.  She refused lumbar puncture yesterday.  Hypercoagulable work-up negative.  Her stroke etiology still unclear.  No A. fib documented on telemetry during hospitalization.  She will follow-up with stroke clinic in [place] in 4 weeks.  Ready for discharge.  Rosalin Hawking, MD PhD Stroke Neurology 08/20/2017 9:45 PM

## 2017-08-20 NOTE — Progress Notes (Addendum)
OT Cancellation Note  Patient Details Name: Stacie Marshall MRN: [id number] DOB: August 10, 1975   Cancelled Treatment:    Reason Eval/Treat Not Completed: Patient at procedure or test/ unavailable; will follow up as schedule permits.   Of note, checked back with Pt this afternoon, pt in with multiple family members and reporting increased fatigue after procedure, requesting to rest at this time. Will follow up as able.   [name], OT Pager [phone] 08/20/2017   Stacie Marshall 08/20/2017, 11:09 AM and 13:54PM

## 2017-08-20 NOTE — Anesthesia Preprocedure Evaluation (Addendum)
Anesthesia Evaluation  Patient identified by MRN, date of birth, ID band Patient awake    Reviewed: Allergy & Precautions, H&P , NPO status , Patient's Chart, lab work & pertinent test results  Airway Mallampati: I  TM Distance: >3 FB Neck ROM: Full    Dental no notable dental hx. (+) Teeth Intact, Dental Advisory Given   Pulmonary neg pulmonary ROS,    Pulmonary exam normal breath sounds clear to auscultation       Cardiovascular hypertension,  Rhythm:Regular Rate:Normal     Neuro/Psych CVA, Residual Symptoms negative psych ROS   GI/Hepatic negative GI ROS, Neg liver ROS,   Endo/Other  negative endocrine ROSMorbid obesity  Renal/GU negative Renal ROS  negative genitourinary   Musculoskeletal   Abdominal (+)  Abdomen: soft. Bowel sounds: normal.  Peds  Hematology negative hematology ROS (+)   Anesthesia Other Findings Started menstrual cycle last Saturday 7/28 per patient.    Reproductive/Obstetrics negative OB ROS                         Anesthesia Physical Anesthesia Plan  ASA: II  Anesthesia Plan: MAC   Post-op Pain Management:    Induction: Intravenous  PONV Risk Score and Plan: 2 and Propofol infusion and Treatment may vary due to age or medical condition  Airway Management Planned: Nasal Cannula  Additional Equipment:   Intra-op Plan:   Post-operative Plan:   Informed Consent: I have reviewed the patients History and Physical, chart, labs and discussed the procedure including the risks, benefits and alternatives for the proposed anesthesia with the patient or authorized representative who has indicated his/her understanding and acceptance.   Dental advisory given  Plan Discussed with: CRNA  Anesthesia Plan Comments:         Anesthesia Quick Evaluation

## 2017-08-20 NOTE — Interval H&P Note (Signed)
History and Physical Interval Note:  08/20/2017 11:37 AM  Stacie Marshall  has presented today for surgery, with the diagnosis of stroke  The various methods of treatment have been discussed with the patient and family. After consideration of risks, benefits and other options for treatment, the patient has consented to  Procedure(s): TRANSESOPHAGEAL ECHOCARDIOGRAM (TEE) WITH MAC (N/A) as a surgical intervention .  The patient's history has been reviewed, patient examined, no change in status, stable for surgery.  I have reviewed the patient's chart and labs.  Questions were answered to the patient's satisfaction.     Mertie Moores

## 2017-08-20 NOTE — Transfer of Care (Signed)
Immediate Anesthesia Transfer of Care Note  Patient: Stacie Marshall  Procedure(s) Performed: TRANSESOPHAGEAL ECHOCARDIOGRAM (TEE) WITH MAC (N/A )  Patient Location: Endoscopy Unit  Anesthesia Type:MAC  Level of Consciousness: awake, alert  and oriented  Airway & Oxygen Therapy: Patient connected to nasal cannula oxygen  Post-op Assessment: Post -op Vital signs reviewed and stable  Post vital signs: stable  Last Vitals:  Vitals Value Taken Time  BP    Temp    Pulse    Resp    SpO2      Last Pain:  Vitals:   08/20/17 1126  TempSrc: Oral  PainSc: 0-No pain      Patients Stated Pain Goal: 6 (71/06/26 9485)  Complications: No apparent anesthesia complications

## 2017-08-20 NOTE — Anesthesia Postprocedure Evaluation (Signed)
Anesthesia Post Note  Patient: Stacie Marshall  Procedure(s) Performed: TRANSESOPHAGEAL ECHOCARDIOGRAM (TEE) WITH MAC (N/A )     Patient location during evaluation: PACU Anesthesia Type: MAC Level of consciousness: awake and alert Pain management: pain level controlled Vital Signs Assessment: post-procedure vital signs reviewed and stable Respiratory status: spontaneous breathing, nonlabored ventilation and respiratory function stable Cardiovascular status: stable and blood pressure returned to baseline Postop Assessment: no apparent nausea or vomiting Anesthetic complications: no    Last Vitals:  Vitals:   08/20/17 1250 08/20/17 1325  BP: 107/87 128/85  Pulse: (!) 59 63  Resp: 16 16  Temp:  36.6 C  SpO2: 100% 100%    Last Pain:  Vitals:   08/20/17 1325  TempSrc: Oral  PainSc:                  Vanden Fawaz,W. EDMOND

## 2017-08-20 NOTE — Progress Notes (Signed)
  Echocardiogram Echocardiogram Transesophageal has been performed.  Leta JunglingCooper, Jakia Kennebrew M 08/20/2017, 12:54 PM

## 2017-08-20 NOTE — CV Procedure (Signed)
    Transesophageal Echocardiogram Note  Stacie Marshall [id number] 12/08/1975  Procedure: Transesophageal Echocardiogram Indications: CVA   Procedure Details Consent: Obtained Time Out: Verified patient identification, verified procedure, site/side was marked, verified correct patient position, special equipment/implants available, Radiology Safety Procedures followed,  medications/allergies/relevent history reviewed, required imaging and test results available.  Performed  Medications:  During this procedure the patient is administered a  Propfol drip total 160 mg total, Lidocaine 30 mg , Versed 1 mg   Left Ventrical:  Mildly reduced LV function - EF 40-45%  Mitral Valve: trace MR   Aortic Valve: normal   Tricuspid Valve: normal   Pulmonic Valve: normal   Left Atrium/ Left atrial appendage: no thrombi   Atrial septum: no ASD or PFO by color or bubble study   Aorta: normal    Complications: No apparent complications Patient did tolerate procedure well.   Vesta MixerPhilip J. Nahser, Montez HagemanJr., MD, Plate[facility] Medical CenterFACC 08/20/2017, 12:33 PM

## 2017-08-20 NOTE — Progress Notes (Signed)
PT Cancellation Note  Patient Details Name: Stacie Marshall MRN: [id number] DOB: 1975/11/05   Cancelled Treatment:    Reason Eval/Treat Not Completed: Patient at procedure or test/unavailable Will follow-up as time allows.  Kipp LaurenceStephanie R Aaron, PT, DPT 08/20/17 11:19 AM Pager: [phone]

## 2017-08-22 ENCOUNTER — Encounter (HOSPITAL_COMMUNITY): Payer: Self-pay | Admitting: Cardiovascular Disease

## 2017-08-23 LAB — PROTHROMBIN GENE MUTATION

## 2017-08-23 NOTE — Care Management Note (Signed)
Case Management Note  Patient Details  Name: Stacie Marshall MRN: [id number] Date of Birth: [birth year]  Subjective/Objective:                    Action/Plan: Pt discharging home with self care. [facility] and she is to f/u with the paperwork for the clinic and obtain an appointment.  CM provided her a MATCH letter to assist with the cost of her d/c meds. CM also provided her [facility]  Family to provide transportation home and 24 hour supervision at home.   Expected Discharge Date:  08/20/17               Expected Discharge Plan:  Home w Home Health Services  In-House Referral:     Discharge planning Services  CM Consult, Medication Assistance, Indigent Health Clinic([facility])  Post Acute Care Choice:    Choice offered to:     DME Arranged:    DME Agency:     HH Arranged:    HH Agency:     Status of Service:  Completed, signed off  If discussed at [employer] Meetings, dates discussed:    Additional Comments:  Kermit BaloKelli F Koltin Wehmeyer, RN 08/23/2017, 5:27 PM

## 2017-09-21 NOTE — Progress Notes (Signed)
[facility] [address] [place]. [place] [address] [phone]       OFFICE FOLLOW UP NOTE  [name] Date of Birth:  1975/03/08 Medical Record Number:  [id number]   Reason for Referral:  hospital stroke follow up  CHIEF COMPLAINT:  Chief Complaint  Patient presents with  . 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 History obtained from patient and chart review. Reviewed all radiology images and labs personally. Stacie Marshall is a 42 year old female with no significant past medical history who presented with left-sided weakness at [facility].  Per notes, patient was in her normal state of health earlier that night but had been drinking and she had witnessed collapsing where she subsequently was noted to have left-sided weakness.  She received IV TPA per telemetry neurology.  In order to obtain CTA, patient had to be intubated due to agitation.  CTA showed right M2 occlusion amenable to intervention and therefore was transferred to [facility].  She was transferred to IR where she underwent TICI 3 revascularization of right M2 occlusion.  MRI head reviewed and showed acute small to moderate right frontoparietal/MCA territory infarct with petechial hemorrhage.  Lower extremity venous Dopplers were negative for DVT.  2D echo showed an EF of 60 to 65% without cardiac source of embolus.  Carotid Dopplers unremarkable.  TEE showed an EF of 40 to 45% without evidence of PFO.  It was recommended to undergo LP to assess for possible vasculitis but patient refused.  Due to age, patient underwent hypercoagulable work-up with ESR 25 and CRP 5.7 but otherwise normal  testing.  LDL 62 and recommended starting Lipitor 10 mg daily.  A1c 5.8.  Patient was not on antithrombotic PTA and recommended DAPT 3 weeks then aspirin alone.  Per notes, atrial fibrillation was not detected on telemetry during hospitalization.  Patient was discharged home in stable condition without therapy needs.  Patient is being seen today for hospital follow-up and is accompanied by her son.  She does complain of residual left arm numbness and pain that localizes at her elbow but otherwise recovering well.  She is also complaining of daily left-sided headaches describing as a stabbing sensation that is accompanied by photophobia.  Patient denies prior migraine or headache history.  Does take Tylenol as needed without relief.  She has completed 3 weeks of DAPT and continues on aspirin 81 mg only without side effects of bleeding or bruising.  Continues to take Lipitor 10 mg without side effects myalgias.  Blood pressure today satisfactory 113/81.  She has not returned to work at this time as she states she is waiting for this appointment in order to be released.  She does work in a [employer] and states she return there a week prior to this appointment and she was unable to stand the smell of the chemicals.  She is concerned of returning back to work due to this.  She has not returned to all previous activities without complications.  Denies new or worsening stroke/TIA symptoms.   ROS:   14 system review of systems performed and negative with exception of headache, weakness, anxiety, not enough sleep  and insomnia  PMH:  Past Medical History:  Diagnosis Date  . Anemia   . HTN (hypertension)   . Stroke [facility])     PSH:  Past Surgical History:  Procedure Laterality Date  . IR CT HEAD LTD  08/15/2017  . IR PERCUTANEOUS ART THROMBECTOMY/INFUSION INTRACRANIAL INC DIAG ANGIO  08/15/2017  . RADIOLOGY WITH ANESTHESIA N/A 08/15/2017   Procedure: RADIOLOGY WITH ANESTHESIA;  Surgeon: Luanne Bras, MD;   Location: [facility];  Service: Radiology;  Laterality: N/A;  . TEE WITHOUT CARDIOVERSION N/A 08/20/2017   Procedure: TRANSESOPHAGEAL ECHOCARDIOGRAM (TEE) WITH MAC;  Surgeon: Acie Fredrickson Wonda Cheng, MD;  Location: [facility] ENDOSCOPY;  Service: Cardiovascular;  Laterality: N/A;    Social History:  Social History   Socioeconomic History  . Marital status: Single    Spouse name: Not on file  . Number of children: Not on file  . Years of education: Not on file  . Highest education level: Not on file  Occupational History  . Not on file  Social Needs  . Financial resource strain: Not on file  . Food insecurity:    Worry: Not on file    Inability: Not on file  . Transportation needs:    Medical: Not on file    Non-medical: Not on file  Tobacco Use  . Smoking status: Former Research scientist (life sciences)  . Smokeless tobacco: Never Used  Substance and Sexual Activity  . Alcohol use: Not Currently    Frequency: Never  . Drug use: Not Currently  . Sexual activity: Yes  Lifestyle  . Physical activity:    Days per week: Not on file    Minutes per session: Not on file  . Stress: Not on file  Relationships  . Social connections:    Talks on phone: Not on file    Gets together: Not on file    Attends religious service: Not on file    Active member of club or organization: Not on file    Attends meetings of clubs or organizations: Not on file    Relationship status: Not on file  . Intimate partner violence:    Fear of current or ex partner: Not on file    Emotionally abused: Not on file    Physically abused: Not on file    Forced sexual activity: Not on file  Other Topics Concern  . Not on file  Social History Narrative  . Not on file    Family History: History reviewed. No pertinent family history.  Medications:   Current Outpatient Medications on File Prior to Visit  Medication Sig Dispense Refill  . aspirin EC 81 MG EC tablet Take 1 tablet (81 mg total) by mouth daily. 30 tablet 2  . cyanocobalamin 1000 MCG  tablet Take 1 tablet (1,000 mcg total) by mouth daily. 30 tablet 2  . ferrous sulfate 325 (65 FE) MG tablet Take 1 tablet (325 mg total) by mouth 3 (three) times daily with meals. 90 tablet 2  . folic acid (FOLVITE) 1 MG tablet Take 1 tablet (1 mg total) by mouth daily. 30 tablet 2  . Multiple Vitamin (MULTIVITAMIN WITH MINERALS) TABS tablet Take 1 tablet by mouth daily. 30 tablet 2  . thiamine 100 MG tablet Take 1 tablet (100 mg total) by mouth daily. 30 tablet 2   No current facility-administered medications on file prior to visit.     Allergies:  No Known Allergies   Physical Exam  Vitals:   09/22/17 1250  BP: 113/81  Pulse: 77  Weight: 188 lb (85.3 kg)  Height: 5' 2" (1.575 m)   Body mass index is 34.39 kg/m. No exam data present  General: well developed, well nourished, pleasant middle-aged Hispanic female, seated, in no evident distress Head: head normocephalic and atraumatic.   Neck: supple with no carotid or supraclavicular bruits Cardiovascular: regular rate and rhythm, no murmurs Musculoskeletal: no deformity Skin:  no rash/petichiae Vascular:  Normal pulses all extremities  Neurologic Exam Mental Status: Awake and fully alert. Oriented to place and time. Recent and remote memory intact. Attention span, concentration and fund of knowledge appropriate. Mood and affect appropriate.  Cranial Nerves: Fundoscopic exam reveals sharp disc margins. Pupils equal, briskly reactive to light. Extraocular movements full without nystagmus. Visual fields full to confrontation. Hearing intact. Facial sensation intact. Face, tongue, palate moves normally and symmetrically.  Motor: Normal bulk and tone. Normal strength in all tested extremity muscles.  No weakness observed during exam Sensory.: intact to touch , pinprick , position and vibratory sensation.  Coordination: Rapid alternating movements normal in all extremities. Finger-to-nose and heel-to-shin performed accurately  bilaterally.  Mild orbiting right arm over left. Gait and Station: Arises from chair without difficulty. Stance is normal. Gait demonstrates normal stride length and balance . Able to heel, toe and tandem walk without difficulty.  Reflexes: 1+ and symmetric. Toes downgoing.    NIHSS  0 Modified Rankin  2   Diagnostic Data (Labs, Imaging, Testing)  MRI Brain WO Contrast  08/16/2017 Acute small to moderate RIGHT frontoparietal/MCA territory infarct with petechial hemorrhage.  CT Abdomen and Pelvis with Contrast 08/16/2017 1. No retroperitoneal hematoma. 2. Small pleural effusions with associated atelectasis. 3. Unchanged mild hypoattenuation of the interpolar right kidney. Correlate with urinalysis.  IR Thrombectomy 08/15/2017 S/P rt common carotid arteriogram followed by complete revascularization of occluded Rt MCA dominant inf division with x 1 pass with embotrap 5 mmm x 8m device achieving a TICI 3 reperfusion  Transthoracic Echocardiogram 08/16/2017 Left ventricle: The cavity size was normal. Systolic function was normal. The estimated ejection fraction was in the range of 60%to 65%.Wall motion was normal; there were no regional wallmotion abnormalities. Left ventricular diastolic functionparameters were normal.  CXR 08/17/2017 Left base atelectasis. Borderline heart size, vascular congestion.  LE Venous Dopplers -no DVT  CUS- Bilateral: 1-39% ICA stenosis. Vertebral artery flow is antegrade.  TEE Left Ventrical:Mildly reduced LV function - EF 40-45% Mitral Valve:trace MR Aortic Valve:normal Tricuspid Valve:normal Pulmonic Valve:normal Left Atrium/ Left atrial appendage:no thrombi Atrial septum:no ASD or PFO by color or bubble study Aorta:normal   ASSESSMENT: Stacie Swensonis a 42y.o. year old female here with right MCA infarct on 08/16/2017 secondary to right M2 occlusion status post IR with TICI 3 revascularization with unclear  etiology. Vascular risk factors include HLD, obesity and possible family history of clotting disorders.  Patient is being seen today for hospital follow-up and overall has recovered well with only mild residual left upper extremity weakness.    PLAN: -Continue aspirin 81 mg daily  and Lipitor for secondary stroke prevention -F/u with PCP regarding your HLD management -Recommend start Topamax 25 mg twice daily for daily headache management -Patient cleared to return to work at this time from a stroke standpoint.  Patient was provided with an office letter as she is applying for Medicaid stating that she has been out of work since hospital discharge due to recent stroke and residual left-sided deficits.  She currently only has minimal deficits remaining, she is  cleared to return.  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 I also advised the patient to eat a healthy diet with plenty of whole grains, cereals, fruits and vegetables, exercise regularly and maintain ideal body weight.  Follow up in 3 months or call earlier if needed   Greater than 50% of time during this 25 minute visit was spent on counseling,explanation of diagnosis of right MCA, reviewing risk factor management of HLD and obesity, planning of further management, discussion with patient and family and coordination of care    Venancio Poisson, AGNP-BC  [facility] [address] [place], [place] [address]  Phone [phone] Fax [phone] Note: This document was prepared  with digital dictation and possible smart phrase technology. Any transcriptional errors that result from this process are unintentional.

## 2017-09-22 ENCOUNTER — Encounter: Payer: Self-pay | Admitting: Adult Health

## 2017-09-22 ENCOUNTER — Ambulatory Visit: Payer: MEDICAID | Admitting: Adult Health

## 2017-09-22 VITALS — BP 113/81 | HR 77 | Ht 62.0 in | Wt 188.0 lb

## 2017-09-22 DIAGNOSIS — I63511 Cerebral infarction due to unspecified occlusion or stenosis of right middle cerebral artery: Secondary | ICD-10-CM

## 2017-09-22 DIAGNOSIS — E785 Hyperlipidemia, unspecified: Secondary | ICD-10-CM

## 2017-09-22 MED ORDER — NICOTINE 21 MG/24HR TD PT24: 21.0000 mg | MEDICATED_PATCH | Freq: Every day | TRANSDERMAL | 0 refills | Status: DC

## 2017-09-22 MED ORDER — TOPIRAMATE 25 MG PO TABS: 25.0000 mg | ORAL_TABLET | Freq: Two times a day (BID) | ORAL | 4 refills | Status: DC

## 2017-09-22 MED ORDER — ATORVASTATIN CALCIUM 10 MG PO TABS: 10.0000 mg | ORAL_TABLET | Freq: Every day | ORAL | 4 refills | Status: DC

## 2017-09-22 NOTE — Patient Instructions (Signed)
Continue aspirin 81 mg daily  and lipitor 10mg   for secondary stroke prevention  Continue to follow up with PCP regarding cholesterol and blood pressure management    Start topamax 25mg  twice a day for headaches  You are cleared to go back to work from a stroke standpoint. Please follow up with PCP if you have other concerns regarding returning to work that does not pertain to your stroke  Continue to monitor blood pressure at home  Maintain strict control of hypertension with blood pressure goal below 130/90, diabetes with hemoglobin A1c goal below 6.5% and cholesterol with LDL cholesterol (bad cholesterol) goal below 70 mg/dL. I also advised the patient to eat a healthy diet with plenty of whole grains, cereals, fruits and vegetables, exercise regularly and maintain ideal body weight.  Followup in the future with me in 3 months or call earlier if needed       Thank you for coming to see [place] at [facility]. I hope we have been able to provide you high quality care today.  You may receive a patient satisfaction survey over the next few weeks. We would appreciate your feedback and comments so that we may continue to improve ourselves and the health of our patients.

## 2017-09-24 NOTE — Progress Notes (Signed)
I agree with the above plan 

## 2017-12-01 ENCOUNTER — Other Ambulatory Visit: Payer: Self-pay

## 2017-12-01 NOTE — Patient Outreach (Signed)
Telephone outreach to patient to obtain mRs was successfully completed. mRs= 3. 

## 2017-12-30 ENCOUNTER — Ambulatory Visit: Payer: MEDICAID | Admitting: Adult Health

## 2017-12-30 ENCOUNTER — Telehealth: Payer: Self-pay

## 2017-12-30 NOTE — Telephone Encounter (Signed)
Patient no show for appt today. 

## 2017-12-31 ENCOUNTER — Encounter: Payer: Self-pay | Admitting: Adult Health

## 2019-01-02 DIAGNOSIS — E785 Hyperlipidemia, unspecified: Secondary | ICD-10-CM | POA: Diagnosis not present

## 2019-01-02 DIAGNOSIS — R079 Chest pain, unspecified: Secondary | ICD-10-CM | POA: Diagnosis not present

## 2019-01-03 DIAGNOSIS — R079 Chest pain, unspecified: Secondary | ICD-10-CM

## 2019-03-08 ENCOUNTER — Telehealth: Payer: Self-pay | Admitting: Cardiology

## 2019-03-08 NOTE — Telephone Encounter (Signed)
Patient calling stating she was referred to Dr. Servando Salina from [facility] in December. She is calling to schedule an appointment.

## 2019-03-08 NOTE — Telephone Encounter (Signed)
Routed to [facility] scheduling

## 2019-03-09 ENCOUNTER — Encounter: Payer: Self-pay | Admitting: *Deleted

## 2019-03-09 DIAGNOSIS — D649 Anemia, unspecified: Secondary | ICD-10-CM | POA: Insufficient documentation

## 2019-03-09 DIAGNOSIS — I639 Cerebral infarction, unspecified: Secondary | ICD-10-CM | POA: Insufficient documentation

## 2019-03-09 DIAGNOSIS — I10 Essential (primary) hypertension: Secondary | ICD-10-CM | POA: Insufficient documentation

## 2019-03-17 ENCOUNTER — Ambulatory Visit: Payer: Medicaid Other

## 2019-03-17 ENCOUNTER — Other Ambulatory Visit: Payer: Self-pay

## 2019-03-17 ENCOUNTER — Ambulatory Visit (INDEPENDENT_AMBULATORY_CARE_PROVIDER_SITE_OTHER): Payer: Medicaid Other | Admitting: Cardiology

## 2019-03-17 ENCOUNTER — Encounter: Payer: Self-pay | Admitting: Cardiology

## 2019-03-17 VITALS — BP 112/90 | HR 75 | Ht 62.0 in | Wt 190.0 lb

## 2019-03-17 DIAGNOSIS — R0989 Other specified symptoms and signs involving the circulatory and respiratory systems: Secondary | ICD-10-CM

## 2019-03-17 DIAGNOSIS — R002 Palpitations: Secondary | ICD-10-CM

## 2019-03-17 DIAGNOSIS — R931 Abnormal findings on diagnostic imaging of heart and coronary circulation: Secondary | ICD-10-CM

## 2019-03-17 DIAGNOSIS — I10 Essential (primary) hypertension: Secondary | ICD-10-CM

## 2019-03-17 DIAGNOSIS — F17200 Nicotine dependence, unspecified, uncomplicated: Secondary | ICD-10-CM

## 2019-03-17 DIAGNOSIS — E782 Mixed hyperlipidemia: Secondary | ICD-10-CM

## 2019-03-17 DIAGNOSIS — R0789 Other chest pain: Secondary | ICD-10-CM

## 2019-03-17 DIAGNOSIS — Z8673 Personal history of transient ischemic attack (TIA), and cerebral infarction without residual deficits: Secondary | ICD-10-CM

## 2019-03-17 HISTORY — DX: Other specified symptoms and signs involving the circulatory and respiratory systems: R09.89

## 2019-03-17 HISTORY — DX: Abnormal findings on diagnostic imaging of heart and coronary circulation: R93.1

## 2019-03-17 HISTORY — DX: Mixed hyperlipidemia: E78.2

## 2019-03-17 HISTORY — DX: Personal history of transient ischemic attack (TIA), and cerebral infarction without residual deficits: Z86.73

## 2019-03-17 HISTORY — DX: Other chest pain: R07.89

## 2019-03-17 HISTORY — DX: Palpitations: R00.2

## 2019-03-17 NOTE — Patient Instructions (Signed)
Medication Instructions:  Your physician recommends that you continue on your current medications as directed. Please refer to the Current Medication list given to you today.  *If you need a refill on your cardiac medications before your next appointment, please call your pharmacy*   Lab Work: Your physician recommends that you return for lab work in: TODAY TSH  If you have labs (blood work) drawn today and your tests are completely normal, you will receive your results only by: Marland Kitchen MyChart Message (if you have MyChart) OR . A paper copy in the mail If you have any lab test that is abnormal or we need to change your treatment, we will call you to review the results.   Testing/Procedures: Your physician has requested that you have an echocardiogram. Echocardiography is a painless test that uses sound waves to create images of your heart. It provides your doctor with information about the size and shape of your heart and how well your heart's chambers and valves are working. This procedure takes approximately one hour. There are no restrictions for this procedure.  A zio monitor was ordered today. It will remain on for 14 days. You will then return monitor and event diary in provided box. It takes 1-2 weeks for report to be downloaded and returned to [place]. We will call you with the results. If monitor falls off or has orange flashing light, please call Zio for further instructions.     Follow-Up: At [facility], you and your health needs are our priority.  As part of our continuing mission to provide you with exceptional heart care, we have created designated Provider Care Teams.  These Care Teams include your primary Cardiologist (physician) and Advanced Practice Providers (APPs -  Physician Assistants and Nurse Practitioners) who all work together to provide you with the care you need, when you need it.  We recommend signing up for the patient portal called "MyChart".  Sign up information is  provided on this After Visit Summary.  MyChart is used to connect with patients for Virtual Visits (Telemedicine).  Patients are able to view lab/test results, encounter notes, upcoming appointments, etc.  Non-urgent messages can be sent to your provider as well.   To learn more about what you can do with MyChart, go to [email or web address].    Your next appointment:   3 month(s)  The format for your next appointment:   In Person  Provider:   Thomasene Ripple, DO   Other Instructions

## 2019-03-17 NOTE — Progress Notes (Signed)
Cardiology Office Note:    Date:  03/17/2019   ID:  Stacie Marshall, DOB Dec 14, 1975, MRN [id number]  PCP:  Rocky Morel, MD  Cardiologist:  Berniece Salines, DO  Electrophysiologist:  None   Referring MD: Huey Romans*   Chief Complaint  Patient presents with  . Hospitalization Follow-up    History of Present Illness:    Stacie Marshall is a 44 y.o. female with a hx of hypertension, hyperlipidemia, obesity, history of CVA, alcohol use and history of mild to moderate depressed ejection fraction with LVEF of 40 to 45% in August 2019.  The patient was recently hospitalized at the [facility] in December at which time she presented for chest pain.  She did undergo a stress test which showed no evidence of reversible ischemia however there were akinesis of the anteroseptal walls suggesting myocardial infarction.  Ejection fraction 49%.  At the time of discharge restart aspirin, statin as well as Lopressor and she was discharged home.  She tells me that her chest pain has improved but not completely resolved.  She notes is having at least once every 2 weeks intermittent few seconds chest pain.  What is more bothersome to the patient is the intermittent palpitation that she experienced.  She notes that she skipped breath intermittent palpitations which are very fast heartbeat that lasted about 5 or so minutes prior to resolution.  She notes that does not matter what she is doing when she get this palpitation.  Nothing make it better or worse.  No other complaints at this time.  Past Medical History:  Diagnosis Date  . Acute blood loss anemia 08/20/2017  . Alcohol abuse   . Anemia   . HTN (hypertension)   . Iron deficiency anemia 08/20/2017  . Leukocytosis 08/20/2017  . 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 Obesity, Class II, BMI 35-39.9   . Smoker   . 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 Stroke ([place])   . UTI  (urinary tract infection) 08/20/2017    Past Surgical History:  Procedure Laterality Date  . IR CT HEAD LTD  08/15/2017  . IR PERCUTANEOUS ART THROMBECTOMY/INFUSION INTRACRANIAL INC DIAG ANGIO  08/15/2017  . RADIOLOGY WITH ANESTHESIA N/A 08/15/2017   Procedure: RADIOLOGY WITH ANESTHESIA;  Surgeon: Luanne Bras, MD;  Location: [facility];  Service: Radiology;  Laterality: N/A;  . TEE WITHOUT CARDIOVERSION N/A 08/20/2017   Procedure: TRANSESOPHAGEAL ECHOCARDIOGRAM (TEE) WITH MAC;  Surgeon: Acie Fredrickson Wonda Cheng, MD;  Location: [facility];  Service: Cardiovascular;  Laterality: N/A;    Current Medications: Current Meds  Medication Sig  . acetaminophen (TYLENOL) 500 MG tablet Take 500 mg by mouth as needed.  Marland Kitchen aspirin EC 81 MG EC tablet Take 1 tablet (81 mg total) by mouth daily.  Marland Kitchen atorvastatin (LIPITOR) 80 MG tablet Take 80 mg by mouth daily.  . cyanocobalamin 1000 MCG tablet Take 1 tablet (1,000 mcg total) by mouth daily.  . ferrous sulfate 325 (65 FE) MG tablet Take 1 tablet (325 mg total) by mouth 3 (three) times daily with meals.  . folic acid (FOLVITE) 1 MG tablet Take 1 tablet (1 mg total) by mouth daily.  . metoprolol tartrate (LOPRESSOR) 25 MG tablet Take 25 mg by mouth 2 (two) times daily.  . Multiple Vitamin (MULTIVITAMIN WITH MINERALS) TABS tablet Take 1 tablet by mouth daily.     Allergies:   Patient has no known allergies.   Social History  Socioeconomic History  . Marital status: Single    Spouse name: Not on file  . Number of children: Not on file  . Years of education: Not on file  . Highest education level: Not on file  Occupational History  . Not on file  Tobacco Use  . Smoking status: Former Research scientist (life sciences)  . Smokeless tobacco: Never Used  Substance and Sexual Activity  . Alcohol use: Not Currently  . Drug use: Not Currently  . Sexual activity: Yes  Other Topics Concern  . Not on file  Social History Narrative  . Not on file   Social Determinants of Health    Financial Resource Strain:   . Difficulty of Paying Living Expenses: Not on file  Food Insecurity:   . Worried About Charity fundraiser in the Last Year: Not on file  . Ran Out of Food in the Last Year: Not on file  Transportation Needs:   . Lack of Transportation (Medical): Not on file  . Lack of Transportation (Non-Medical): Not on file  Physical Activity:   . Days of Exercise per Week: Not on file  . Minutes of Exercise per Session: Not on file  Stress:   . Feeling of Stress : Not on file  Social Connections:   . Frequency of Communication with Friends and Family: Not on file  . Frequency of Social Gatherings with Friends and Family: Not on file  . Attends Religious Services: Not on file  . Active Member of Clubs or Organizations: Not on file  . Attends Archivist Meetings: Not on file  . Marital Status: Not on file     Family History: The patient's family history includes Breast cancer in her maternal grandmother; Diabetes in her mother; Hypercholesterolemia in her maternal grandmother; Hypertension in her maternal grandmother and mother.  ROS:   Review of Systems  Constitution: Negative for decreased appetite, fever and weight gain.  HENT: Negative for congestion, ear discharge, hoarse voice and sore throat.   Eyes: Negative for discharge, redness, vision loss in right eye and visual halos.  Cardiovascular: Reports chest pain and intermittent palpitation.  Negative for leg swelling, orthopnea.  Respiratory: Negative for cough, hemoptysis, shortness of breath and snoring.   Endocrine: Negative for heat intolerance and polyphagia.  Hematologic/Lymphatic: Negative for bleeding problem. Does not bruise/bleed easily.  Skin: Negative for flushing, nail changes, rash and suspicious lesions.  Musculoskeletal: Negative for arthritis, joint pain, muscle cramps, myalgias, neck pain and stiffness.  Gastrointestinal: Negative for abdominal pain, bowel incontinence, diarrhea  and excessive appetite.  Genitourinary: Negative for decreased libido, genital sores and incomplete emptying.  Neurological: Negative for brief paralysis, focal weakness, headaches and loss of balance.  Psychiatric/Behavioral: Negative for altered mental status, depression and suicidal ideas.  Allergic/Immunologic: Negative for HIV exposure and persistent infections.    EKGs/Labs/Other Studies Reviewed:    The following studies were reviewed today:   EKG:  The ekg ordered today demonstrates sinus rhythm, heart rate 76 bpm, compared to EKG done in the hospital which showed evidence of sinus rhythm, heart rate 86 bpm with possible old lateral wall infarction.  Pharmacologic nuclear stress test done on January 03, 2019 at the [facility] reports no evidence of reversible ischemia.  However there does appear to be a moderate region of infarction/scarring in the anteroseptal wall of the mid ventricle and apex.  There is small focal scarring or diaphragmatic attenuation in the inferior wall from base to apex.  Hypokinesis of the  anteroseptal and septal walls.  Left ventricle ejection fraction 49%.   Transesophageal echocardiogram in August 2019 showed Study Conclusions  - Left ventricle: Systolic function was mildly to moderately  reduced. The estimated ejection fraction was in the range of 40%  to 45%.  - Aortic valve: There was trivial regurgitation.  - Atrial septum: No defect or patent foramen ovale was identified.  - Pulmonic valve: No evidence of vegetation.  Recent Labs: CBC: WBC 7.7, hemoglobin 11.6, hematocrit 37.2, platelet 302 Chemistry: Sodium 138, potassium 3.6, bicarb 24, chloride 106, BUN 8, creatinine 0.5, glucose 100  Recent Lipid Panel    Component Value Date/Time   CHOL 127 08/15/2017 0729   TRIG 208 (H) 08/15/2017 0729   TRIG 200 (H) 08/15/2017 0729   HDL 23 (L) 08/15/2017 0729   CHOLHDL 5.5 08/15/2017 0729   VLDL 42 (H) 08/15/2017 0729   LDLCALC  62 08/15/2017 0729    Physical Exam:    VS:  BP 112/90 (BP Location: Left Arm, Patient Position: Sitting, Cuff Size: Normal)   Pulse 75   Ht _0  (1.575 m)   Wt 190 lb (86.2 kg)   SpO2 97%   BMI 34.75 kg/m     Wt Readings from Last 3 Encounters:  03/17/19 190 lb (86.2 kg)  09/22/17 188 lb (85.3 kg)  08/20/17 193 lb (87.5 kg)     GEN: Well nourished, well developed in no acute distress HEENT: Normal NECK: No JVD; No carotid bruits LYMPHATICS: No lymphadenopathy CARDIAC: S1S2 noted,RRR, no murmurs, rubs, gallops RESPIRATORY:  Clear to auscultation without rales, wheezing or rhonchi  ABDOMEN: Soft, non-tender, non-distended, +bowel sounds, no guarding. EXTREMITIES: No edema, No cyanosis, no clubbing MUSCULOSKELETAL:  No deformity  SKIN: Warm and dry NEUROLOGIC:  Alert and oriented x 3, non-focal PSYCHIATRIC:  Normal affect, good insight  ASSESSMENT:    1. Palpitations   2. Chest tightness   3. Essential hypertension   4. Mixed hyperlipidemia   5. History of CVA (cerebrovascular accident)   6. Smoker   7. Depressed left ventricular ejection fraction    PLAN:     Palpitations-I would like to rule out a cardiovascular etiology of this palpitation, therefore at this time I would like to placed a zio patch for  14 days. In additon a transthoracic echocardiogram will be ordered to assess LV/RV function and any structural abnormalities. Once these testing have been performed amd reviewed further reccomendations will be made. For now, I do reccomend that the patient goes to the nearest ED if  symptoms recur.  Chest tightness-this does sound atypical.  I reviewed her recent stress test done and [facility] with does not show any reversible ischemia.  For now we will continue patient on aspirin and statin.  If this persists there will be some value in starting the patient on additional antianginals she is already on a beta-blocker.  In terms of her mildly depressed ejection  fraction with her nuclear EF of 49%-continue patient on the beta-blocker for now.  If needed we will add ACE inhibitor as blood pressure can tolerate.  But will get more information after her echocardiogram.  Hyperlipidemia, have encouraged patient to continue on her statin medication.  History of CVA-continue patient on aspirin and statin.  Blood work will be done today for TSH.  The patient is in agreement with the above plan. The patient left the office in stable condition.  The patient will follow up in 3 months or sooner if needed.  Medication Adjustments/Labs and Tests Ordered: Current medicines are reviewed at length with the patient today.  Concerns regarding medicines are outlined above.  Orders Placed This Encounter  Procedures  . TSH  . LONG TERM MONITOR (3-14 DAYS)  . EKG 12-Lead  . ECHOCARDIOGRAM COMPLETE   No orders of the defined types were placed in this encounter.   Patient Instructions  Medication Instructions:  Your physician recommends that you continue on your current medications as directed. Please refer to the Current Medication list given to you today.  *If you need a refill on your cardiac medications before your next appointment, please call your pharmacy*   Lab Work: Your physician recommends that you return for lab work in: TODAY TSH  If you have labs (blood work) drawn today and your tests are completely normal, you will receive your results only by: Marland Kitchen MyChart Message (if you have MyChart) OR . A paper copy in the mail If you have any lab test that is abnormal or we need to change your treatment, we will call you to review the results.   Testing/Procedures: Your physician has requested that you have an echocardiogram. Echocardiography is a painless test that uses sound waves to create images of your heart. It provides your doctor with information about the size and shape of your heart and how well your heart's chambers and valves are working.  This procedure takes approximately one hour. There are no restrictions for this procedure.  A zio monitor was ordered today. It will remain on for 14 days. You will then return monitor and event diary in provided box. It takes 1-2 weeks for report to be downloaded and returned to [place]. We will call you with the results. If monitor falls off or has orange flashing light, please call Zio for further instructions.     Follow-Up: At [facility], you and your health needs are our priority.  As part of our continuing mission to provide you with exceptional heart care, we have created designated Provider Care Teams.  These Care Teams include your primary Cardiologist (physician) and Advanced Practice Providers (APPs -  Physician Assistants and Nurse Practitioners) who all work together to provide you with the care you need, when you need it.  We recommend signing up for the patient portal called "MyChart".  Sign up information is provided on this After Visit Summary.  MyChart is used to connect with patients for Virtual Visits (Telemedicine).  Patients are able to view lab/test results, encounter notes, upcoming appointments, etc.  Non-urgent messages can be sent to your provider as well.   To learn more about what you can do with MyChart, go to [email or web address].    Your next appointment:   3 month(s)  The format for your next appointment:   In Person  Provider:   Berniece Salines, DO   Other Instructions      Adopting a Healthy Lifestyle.  Know what a healthy weight is for you (roughly BMI <25) and aim to maintain this   Aim for 7+ servings of fruits and vegetables daily   65-80+ fluid ounces of water or unsweet tea for healthy kidneys   Limit to max 1 drink of alcohol per day; avoid smoking/tobacco   Limit animal fats in diet for cholesterol and heart health - choose grass fed whenever available   Avoid highly processed foods, and foods high in saturated/trans fats   Aim for  low stress - take time to unwind and care for your mental  health   Aim for 150 min of moderate intensity exercise weekly for heart health, and weights twice weekly for bone health   Aim for 7-9 hours of sleep daily   When it comes to diets, agreement about the perfect plan isnt easy to find, even among the experts. Experts at the [employer] developed an idea known as the Healthy Eating Plate. Just imagine a plate divided into logical, healthy portions.   The emphasis is on diet quality:   Load up on vegetables and fruits - one-half of your plate: Aim for color and variety, and remember that potatoes dont count.   Go for whole grains - one-quarter of your plate: Whole wheat, barley, wheat berries, quinoa, oats, brown rice, and foods made with them. If you want pasta, go with whole wheat pasta.   Protein power - one-quarter of your plate: Fish, chicken, beans, and nuts are all healthy, versatile protein sources. Limit red meat.   The diet, however, does go beyond the plate, offering a few other suggestions.   Use healthy plant oils, such as olive, canola, soy, corn, sunflower and peanut. Check the labels, and avoid partially hydrogenated oil, which have unhealthy trans fats.   If youre thirsty, drink water. Coffee and tea are good in moderation, but skip sugary drinks and limit milk and dairy products to one or two daily servings.   The type of carbohydrate in the diet is more important than the amount. Some sources of carbohydrates, such as vegetables, fruits, whole grains, and beans-are healthier than others.   Finally, stay active  Signed, Berniece Salines, DO  03/17/2019 12:08 PM    [place]

## 2019-03-18 LAB — TSH: TSH: 1.94 u[IU]/mL (ref 0.450–4.500)

## 2019-03-20 ENCOUNTER — Telehealth: Payer: Self-pay | Admitting: Cardiology

## 2019-03-20 NOTE — Telephone Encounter (Signed)
Patient aware of lab results and verbalized understanding.

## 2019-03-20 NOTE — Telephone Encounter (Signed)
Patient states she is returning a call requesting results from lab work completed on 03/17/19.

## 2019-03-22 ENCOUNTER — Encounter: Payer: Self-pay | Admitting: *Deleted

## 2019-04-28 ENCOUNTER — Other Ambulatory Visit: Payer: Medicaid Other

## 2019-05-03 DIAGNOSIS — N930 Postcoital and contact bleeding: Secondary | ICD-10-CM

## 2019-05-03 HISTORY — DX: Postcoital and contact bleeding: N93.0

## 2019-05-21 DIAGNOSIS — E785 Hyperlipidemia, unspecified: Secondary | ICD-10-CM

## 2019-05-21 DIAGNOSIS — R079 Chest pain, unspecified: Secondary | ICD-10-CM

## 2019-05-21 DIAGNOSIS — Z8673 Personal history of transient ischemic attack (TIA), and cerebral infarction without residual deficits: Secondary | ICD-10-CM

## 2019-05-21 DIAGNOSIS — R0789 Other chest pain: Secondary | ICD-10-CM

## 2019-05-24 ENCOUNTER — Telehealth: Payer: Self-pay

## 2019-05-24 ENCOUNTER — Ambulatory Visit: Payer: Medicaid Other | Admitting: Cardiology

## 2019-05-24 NOTE — Telephone Encounter (Signed)
Spoke to the patient and got her appointment rescheduled for 05/30/19.

## 2019-05-24 NOTE — Telephone Encounter (Signed)
-----   Message from Stacie Ripple, DO sent at 05/24/2019 11:55 AM EDT ----- Please call this patient, she missed her appointment today.  Please her know that is important for her to follow-up with her especially in the setting her recent hospitalization.  I discussed with her in the hospital that she needed a coronary CTA and she agreed to this time that we had given to her.  She needs to follow-up as further testing will be necessary in helping to understand her coronary artery disease.

## 2019-05-29 ENCOUNTER — Other Ambulatory Visit: Payer: Self-pay

## 2019-05-30 ENCOUNTER — Other Ambulatory Visit: Payer: Self-pay

## 2019-05-30 ENCOUNTER — Encounter: Payer: Self-pay | Admitting: Cardiology

## 2019-05-30 ENCOUNTER — Ambulatory Visit (INDEPENDENT_AMBULATORY_CARE_PROVIDER_SITE_OTHER): Payer: Medicaid Other | Admitting: Cardiology

## 2019-05-30 VITALS — BP 114/94 | HR 83 | Ht 62.0 in | Wt 191.0 lb

## 2019-05-30 DIAGNOSIS — I6601 Occlusion and stenosis of right middle cerebral artery: Secondary | ICD-10-CM

## 2019-05-30 DIAGNOSIS — E785 Hyperlipidemia, unspecified: Secondary | ICD-10-CM

## 2019-05-30 DIAGNOSIS — R079 Chest pain, unspecified: Secondary | ICD-10-CM

## 2019-05-30 DIAGNOSIS — R0989 Other specified symptoms and signs involving the circulatory and respiratory systems: Secondary | ICD-10-CM

## 2019-05-30 DIAGNOSIS — R931 Abnormal findings on diagnostic imaging of heart and coronary circulation: Secondary | ICD-10-CM

## 2019-05-30 DIAGNOSIS — I10 Essential (primary) hypertension: Secondary | ICD-10-CM

## 2019-05-30 DIAGNOSIS — E669 Obesity, unspecified: Secondary | ICD-10-CM

## 2019-05-30 DIAGNOSIS — E66812 Obesity, class 2: Secondary | ICD-10-CM

## 2019-05-30 HISTORY — DX: Chest pain, unspecified: R07.9

## 2019-05-30 MED ORDER — RANOLAZINE ER 500 MG PO TB12: 500.0000 mg | ORAL_TABLET | Freq: Two times a day (BID) | ORAL | 3 refills | Status: AC

## 2019-05-30 MED ORDER — METOPROLOL TARTRATE 100 MG PO TABS: 100.0000 mg | ORAL_TABLET | Freq: Once | ORAL | 0 refills | Status: DC

## 2019-05-30 NOTE — Progress Notes (Signed)
Cardiology Office Note:    Date:  05/30/2019   ID:  Woodroe Chen, DOB [birth year], MRN [id number]  PCP:  Rocky Morel, MD  Cardiologist:  Berniece Salines, DO  Electrophysiologist:  None   Referring MD: Huey Romans*   Chief Complaint  Patient presents with  . Follow-up  " I was recently in the hospital for chest pain"  History of Present Illness:    Stacie Marshall is a 44 y.o. female with a hx of hypertension, hyperlipidemia, obesity, history of CVA, alcohol use and history of mild to moderate depressed ejection fraction with LVEF of 40 to 45% in August 2019.  I did see the patient in February 2021 after she was hospitalized [facility] for chest pain.  At that time she did undergo a stress test which showed no evidence of reversible ischemia however there were akinesis of the anteroseptal walls suggesting myocardial infarction.  Ejection fraction 49%.  At the time of discharge restart aspirin, statin as well as Lopressor and she was discharged home.  At the conclusion her visit she no longer was experiencing chest pain but reported palpitations therefore we will place a ZIO monitor on the patient and ordered an echocardiogram.  The patient did not wear the ZIO monitor or get her echocardiogram.  In the interim she ended up at the hospital due to chest pain. I did see the patient in hospital at that time I started her on Imdur 30 mg daily.  She is here today for follow-up she told me that she did stop the Imdur due to severe debilitating headache.  She still does have intermittent chest pain.   Past Medical History:  Diagnosis Date  . Acute blood loss anemia 08/20/2017  . Alcohol abuse   . Anemia   . HTN (hypertension)   . Iron deficiency anemia 08/20/2017  . Leukocytosis 08/20/2017  . 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 Obesity, Class II, BMI 35-39.9   . Smoker   . 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  Stroke ([place])   . UTI (urinary tract infection) 08/20/2017    Past Surgical History:  Procedure Laterality Date  . IR CT HEAD LTD  08/15/2017  . IR PERCUTANEOUS ART THROMBECTOMY/INFUSION INTRACRANIAL INC DIAG ANGIO  08/15/2017  . RADIOLOGY WITH ANESTHESIA N/A 08/15/2017   Procedure: RADIOLOGY WITH ANESTHESIA;  Surgeon: Luanne Bras, MD;  Location: [place];  Service: Radiology;  Laterality: N/A;  . TEE WITHOUT CARDIOVERSION N/A 08/20/2017   Procedure: TRANSESOPHAGEAL ECHOCARDIOGRAM (TEE) WITH MAC;  Surgeon: Acie Fredrickson Wonda Cheng, MD;  Location: [place] [facility];  Service: Cardiovascular;  Laterality: N/A;    Current Medications: Current Meds  Medication Sig  . acetaminophen (TYLENOL) 500 MG tablet Take 500 mg by mouth as needed.  Marland Kitchen aspirin EC 81 MG EC tablet Take 1 tablet (81 mg total) by mouth daily.  Marland Kitchen atorvastatin (LIPITOR) 80 MG tablet Take 80 mg by mouth daily.  . cyanocobalamin 1000 MCG tablet Take 1 tablet (1,000 mcg total) by mouth daily.  . ferrous sulfate 325 (65 FE) MG tablet Take 1 tablet (325 mg total) by mouth 3 (three) times daily with meals.  . fluticasone (FLONASE) 50 MCG/ACT nasal spray SHAKE LIQUID AND USE 2 SPRAYS IN EACH NOSTRIL DAILY  . ibuprofen (ADVIL) 800 MG tablet Take 800 mg by mouth daily.  . metoprolol tartrate (LOPRESSOR) 25 MG tablet Take 25 mg by mouth 2 (two) times daily.  . Multiple Vitamin (  MULTIVITAMIN WITH MINERALS) TABS tablet Take 1 tablet by mouth daily.     Allergies:   Patient has no known allergies.   Social History   Socioeconomic History  . Marital status: Single    Spouse name: Not on file  . Number of children: Not on file  . Years of education: Not on file  . Highest education level: Not on file  Occupational History  . Not on file  Tobacco Use  . Smoking status: Former Research scientist (life sciences)  . Smokeless tobacco: Never Used  Substance and Sexual Activity  . Alcohol use: Not Currently  . Drug use: Not Currently  . Sexual activity: Yes  Other Topics  Concern  . Not on file  Social History Narrative  . Not on file   Social Determinants of Health   Financial Resource Strain:   . Difficulty of Paying Living Expenses:   Food Insecurity:   . Worried About Charity fundraiser in the Last Year:   . Arboriculturist in the Last Year:   Transportation Needs:   . Film/video editor (Medical):   Marland Kitchen Lack of Transportation (Non-Medical):   Physical Activity:   . Days of Exercise per Week:   . Minutes of Exercise per Session:   Stress:   . Feeling of Stress :   Social Connections:   . Frequency of Communication with Friends and Family:   . Frequency of Social Gatherings with Friends and Family:   . Attends Religious Services:   . Active Member of Clubs or Organizations:   . Attends Archivist Meetings:   Marland Kitchen Marital Status:      Family History: The patient's family history includes Breast cancer in her maternal grandmother; Diabetes in her mother; Hypercholesterolemia in her maternal grandmother; Hypertension in her maternal grandmother and mother.  ROS:   Review of Systems  Constitution: Negative for decreased appetite, fever and weight gain.  HENT: Negative for congestion, ear discharge, hoarse voice and sore throat.   Eyes: Negative for discharge, redness, vision loss in right eye and visual halos.  Cardiovascular: Reports chest pain.  Negative for dyspnea on exertion, leg swelling, orthopnea and palpitations.  Respiratory: Negative for cough, hemoptysis, shortness of breath and snoring.   Endocrine: Negative for heat intolerance and polyphagia.  Hematologic/Lymphatic: Negative for bleeding problem. Does not bruise/bleed easily.  Skin: Negative for flushing, nail changes, rash and suspicious lesions.  Musculoskeletal: Negative for arthritis, joint pain, muscle cramps, myalgias, neck pain and stiffness.  Gastrointestinal: Negative for abdominal pain, bowel incontinence, diarrhea and excessive appetite.  Genitourinary:  Negative for decreased libido, genital sores and incomplete emptying.  Neurological: Negative for brief paralysis, focal weakness, headaches and loss of balance.  Psychiatric/Behavioral: Negative for altered mental status, depression and suicidal ideas.  Allergic/Immunologic: Negative for HIV exposure and persistent infections.    EKGs/Labs/Other Studies Reviewed:    The following studies were reviewed today:   EKG: None today  Pharmacologic nuclear stress test done on January 03, 2019 at the [facility] reports no evidence of reversible ischemia.  However there does appear to be a moderate region of infarction/scarring in the anteroseptal wall of the mid ventricle and apex.  There is small focal scarring or diaphragmatic attenuation in the inferior wall from base to apex.  Hypokinesis of the anteroseptal and septal walls.  Left ventricle ejection fraction 49%  Recent Labs: 03/17/2019: TSH 1.940  Recent Lipid Panel    Component Value Date/Time   CHOL 127  08/15/2017 0729   TRIG 208 (H) 08/15/2017 0729   TRIG 200 (H) 08/15/2017 0729   HDL 23 (L) 08/15/2017 0729   CHOLHDL 5.5 08/15/2017 0729   VLDL 42 (H) 08/15/2017 0729   LDLCALC 62 08/15/2017 0729    Physical Exam:    VS:  BP (!) 114/94 (BP Location: Left Arm, Patient Position: Sitting, Cuff Size: Normal)   Pulse 83   Ht _0  (1.575 m)   Wt 191 lb (86.6 kg)   SpO2 99%   BMI 34.93 kg/m     Wt Readings from Last 3 Encounters:  05/30/19 191 lb (86.6 kg)  03/17/19 190 lb (86.2 kg)  09/22/17 188 lb (85.3 kg)     GEN: Well nourished, well developed in no acute distress HEENT: Normal NECK: No JVD; No carotid bruits LYMPHATICS: No lymphadenopathy CARDIAC: S1S2 noted,RRR, no murmurs, rubs, gallops RESPIRATORY:  Clear to auscultation without rales, wheezing or rhonchi  ABDOMEN: Soft, non-tender, non-distended, +bowel sounds, no guarding. EXTREMITIES: No edema, No cyanosis, no clubbing MUSCULOSKELETAL:  No  deformity  SKIN: Warm and dry NEUROLOGIC:  Alert and oriented x 3, non-focal PSYCHIATRIC:  Normal affect, good insight  ASSESSMENT:    1. Chest pain of uncertain etiology   2. Depressed left ventricular ejection fraction   3. Dyslipidemia   4. Middle cerebral artery embolism, right   5. Essential hypertension   6. Obesity, Class II, BMI 35-39.9    PLAN:     Her recurrent chest pain is concerning.  She has had abnormal stress test did not show any ischemia but infarction.  With her symptoms I am going to have the patient undergo another ischemic evaluation at this time close to more definitive testing with a coronary CTA.  She does not have IV contrast dye allergy and is agreeable to proceed with this testing.  In the meantime I am going to start her on Ranexa 500 mg twice daily giving her side effect to Imdur.  She will continue her aspirin 81 mg as well as her atorvastatin 80 mg daily.  Hypertension-her blood pressure deceptively in the office today we will continue her current medication regimen.  Hyperlipidemia continue patient on Lipitor 80 mg daily.  Plan for repeat lipid profile at her next visit  Obesity-the patient understands the need to lose weight with diet and exercise. We have discussed specific strategies for this.  The patient is in agreement with the above plan. The patient left the office in stable condition.  The patient will follow up in 3 months or sooner if needed.   Medication Adjustments/Labs and Tests Ordered: Current medicines are reviewed at length with the patient today.  Concerns regarding medicines are outlined above.  Orders Placed This Encounter  Procedures  . CT CORONARY MORPH W/CTA COR W/SCORE W/CA W/CM &/OR WO/CM  . CT CORONARY FRACTIONAL FLOW RESERVE DATA PREP  . CT CORONARY FRACTIONAL FLOW RESERVE FLUID ANALYSIS  . Basic Metabolic Panel (BMET)   Meds ordered this encounter  Medications  . ranolazine (RANEXA) 500 MG 12 hr tablet    Sig:  Take 1 tablet (500 mg total) by mouth 2 (two) times daily.    Dispense:  60 tablet    Refill:  3  . metoprolol tartrate (LOPRESSOR) 100 MG tablet    Sig: Take 1 tablet (100 mg total) by mouth once for 1 dose.    Dispense:  1 tablet    Refill:  0    Patient Instructions  Medication Instructions:  Your physician has recommended you make the following change in your medication:  START: Renexa 500 mg take one tablet by mouth twice daily.  *If you need a refill on your cardiac medications before your next appointment, please call your pharmacy*   Lab Work: Your physician recommends that you return for lab work in: TODAY BMP If you have labs (blood work) drawn today and your tests are completely normal, you will receive your results only by: Marland Kitchen MyChart Message (if you have MyChart) OR . A paper copy in the mail If you have any lab test that is abnormal or we need to change your treatment, we will call you to review the results.   Testing/Procedures: Your cardiac CT will be scheduled at the below location:   [facility] [address] [place], [place] [address] [phone]  If scheduled at [facility], please arrive at the [facility] main entrance of [facility] 30 minutes prior to test start time. Proceed to the [facility] Radiology Department (first floor) to check-in and test prep.   Please follow these instructions carefully (unless otherwise directed):   On the Night Before the Test: . Be sure to Drink plenty of water. . Do not consume any caffeinated/decaffeinated beverages or chocolate 12 hours prior to your test. . Do not take any antihistamines 12 hours prior to your test.  On the Day of the Test: . Drink plenty of water. Do not drink any water within one hour of the test. . Do not eat any food 4 hours prior to the test. . You may take your regular medications prior to the test.  . Take metoprolol (Lopressor) two hours prior to  test. . FEMALES- please wear underwire-free bra if available        After the Test: . Drink plenty of water. . After receiving IV contrast, you may experience a mild flushed feeling. This is normal. . On occasion, you may experience a mild rash up to 24 hours after the test. This is not dangerous. If this occurs, you can take Benadryl 25 mg and increase your fluid intake. . If you experience trouble breathing, this can be serious. If it is severe call 911 IMMEDIATELY. If it is mild, please call our office. . If you take any of these medications: Glipizide/Metformin, Avandament, Glucavance, please do not take 48 hours after completing test unless otherwise instructed.   Once we have confirmed authorization from your insurance company, we will call you to set up a date and time for your test.   For non-scheduling related questions, please contact the cardiac imaging nurse navigator should you have any questions/concerns: Marchia Bond, RN Navigator Cardiac Imaging [facility] [phone] office  For scheduling needs, including cancellations and rescheduling, please call [phone].      Follow-Up: At [facility], you and your health needs are our priority.  As part of our continuing mission to provide you with exceptional heart care, we have created designated Provider Care Teams.  These Care Teams include your primary Cardiologist (physician) and Advanced Practice Providers (APPs -  Physician Assistants and Nurse Practitioners) who all work together to provide you with the care you need, when you need it.  We recommend signing up for the patient portal called "MyChart".  Sign up information is provided on this After Visit Summary.  MyChart is used to connect with patients for Virtual Visits (Telemedicine).  Patients are able to view lab/test results,  encounter notes, upcoming appointments, etc.  Non-urgent messages can be sent to your provider as well.   To  learn more about what you can do with MyChart, go to [email or web address].    Your next appointment:   3 month(s)  The format for your next appointment:   In Person  Provider:   Berniece Salines, DO   Other Instructions      Adopting a Healthy Lifestyle.  Know what a healthy weight is for you (roughly BMI <25) and aim to maintain this   Aim for 7+ servings of fruits and vegetables daily   65-80+ fluid ounces of water or unsweet tea for healthy kidneys   Limit to max 1 drink of alcohol per day; avoid smoking/tobacco   Limit animal fats in diet for cholesterol and heart health - choose grass fed whenever available   Avoid highly processed foods, and foods high in saturated/trans fats   Aim for low stress - take time to unwind and care for your mental health   Aim for 150 min of moderate intensity exercise weekly for heart health, and weights twice weekly for bone health   Aim for 7-9 hours of sleep daily   When it comes to diets, agreement about the perfect plan isnt easy to find, even among the experts. Experts at the [employer] developed an idea known as the Healthy Eating Plate. Just imagine a plate divided into logical, healthy portions.   The emphasis is on diet quality:   Load up on vegetables and fruits - one-half of your plate: Aim for color and variety, and remember that potatoes dont count.   Go for whole grains - one-quarter of your plate: Whole wheat, barley, wheat berries, quinoa, oats, brown rice, and foods made with them. If you want pasta, go with whole wheat pasta.   Protein power - one-quarter of your plate: Fish, chicken, beans, and nuts are all healthy, versatile protein sources. Limit red meat.   The diet, however, does go beyond the plate, offering a few other suggestions.   Use healthy plant oils, such as olive, canola, soy, corn, sunflower and peanut. Check the labels, and avoid partially hydrogenated oil, which have  unhealthy trans fats.   If youre thirsty, drink water. Coffee and tea are good in moderation, but skip sugary drinks and limit milk and dairy products to one or two daily servings.   The type of carbohydrate in the diet is more important than the amount. Some sources of carbohydrates, such as vegetables, fruits, whole grains, and beans-are healthier than others.   Finally, stay active  Signed, Berniece Salines, DO  05/30/2019 5:44 PM    [facility]

## 2019-05-30 NOTE — Patient Instructions (Signed)
Medication Instructions:  Your physician has recommended you make the following change in your medication:  START: Renexa 500 mg take one tablet by mouth twice daily.  *If you need a refill on your cardiac medications before your next appointment, please call your pharmacy*   Lab Work: Your physician recommends that you return for lab work in: TODAY BMP If you have labs (blood work) drawn today and your tests are completely normal, you will receive your results only by: Marland Kitchen MyChart Message (if you have MyChart) OR . A paper copy in the mail If you have any lab test that is abnormal or we need to change your treatment, we will call you to review the results.   Testing/Procedures: Your cardiac CT will be scheduled at the below location:   [facility] [address] [place], [place] [address] [phone]  If scheduled at [facility], please arrive at the [facility] main entrance of [facility] 30 minutes prior to test start time. Proceed to the [facility] Radiology Department (first floor) to check-in and test prep.   Please follow these instructions carefully (unless otherwise directed):   On the Night Before the Test: . Be sure to Drink plenty of water. . Do not consume any caffeinated/decaffeinated beverages or chocolate 12 hours prior to your test. . Do not take any antihistamines 12 hours prior to your test.  On the Day of the Test: . Drink plenty of water. Do not drink any water within one hour of the test. . Do not eat any food 4 hours prior to the test. . You may take your regular medications prior to the test.  . Take metoprolol (Lopressor) two hours prior to test. . FEMALES- please wear underwire-free bra if available        After the Test: . Drink plenty of water. . After receiving IV contrast, you may experience a mild flushed feeling. This is normal. . On occasion, you may experience a mild rash up to 24 hours after the test. This  is not dangerous. If this occurs, you can take Benadryl 25 mg and increase your fluid intake. . If you experience trouble breathing, this can be serious. If it is severe call 911 IMMEDIATELY. If it is mild, please call our office. . If you take any of these medications: Glipizide/Metformin, Avandament, Glucavance, please do not take 48 hours after completing test unless otherwise instructed.   Once we have confirmed authorization from your insurance company, we will call you to set up a date and time for your test.   For non-scheduling related questions, please contact the cardiac imaging nurse navigator should you have any questions/concerns: Marchia Bond, RN Navigator Cardiac Imaging [facility] [phone] office  For scheduling needs, including cancellations and rescheduling, please call [phone].      Follow-Up: At [facility], you and your health needs are our priority.  As part of our continuing mission to provide you with exceptional heart care, we have created designated Provider Care Teams.  These Care Teams include your primary Cardiologist (physician) and Advanced Practice Providers (APPs -  Physician Assistants and Nurse Practitioners) who all work together to provide you with the care you need, when you need it.  We recommend signing up for the patient portal called "MyChart".  Sign up information is provided on this After Visit Summary.  MyChart is used to connect with patients for Virtual Visits (Telemedicine).  Patients are able to  view lab/test results, encounter notes, upcoming appointments, etc.  Non-urgent messages can be sent to your provider as well.   To learn more about what you can do with MyChart, go to [email or web address].    Your next appointment:   3 month(s)  The format for your next appointment:   In Person  Provider:   Thomasene Ripple, DO   Other Instructions

## 2019-05-31 ENCOUNTER — Telehealth: Payer: Self-pay

## 2019-05-31 LAB — BASIC METABOLIC PANEL
BUN/Creatinine Ratio: 13 (ref 9–23)
BUN: 6 mg/dL (ref 6–24)
CO2: 21 mmol/L (ref 20–29)
Calcium: 9.7 mg/dL (ref 8.7–10.2)
Chloride: 106 mmol/L (ref 96–106)
Creatinine, Ser: 0.48 mg/dL — ABNORMAL LOW (ref 0.57–1.00)
GFR calc Af Amer: 140 mL/min/{1.73_m2} (ref >59)
GFR calc non Af Amer: 121 mL/min/{1.73_m2} (ref >59)
Glucose: 138 mg/dL — ABNORMAL HIGH (ref 65–99)
Potassium: 4.6 mmol/L (ref 3.5–5.2)
Sodium: 139 mmol/L (ref 134–144)

## 2019-05-31 NOTE — Telephone Encounter (Signed)
Left message on patients voicemail to please return our call.   

## 2019-05-31 NOTE — Telephone Encounter (Signed)
-----   Message from Thomasene Ripple, DO sent at 05/31/2019  2:00 PM EDT ----- Blood glucose slightly elevated but otherwise labs stable

## 2019-05-31 NOTE — Telephone Encounter (Signed)
      I went in pt's chart to see who had called her today about her results

## 2019-05-31 NOTE — Telephone Encounter (Signed)
Spoke with patient regarding results.  Patient verbalizes understanding and is agreeable to plan of care. Advised patient to call back with any issues or concerns.  

## 2019-06-06 ENCOUNTER — Telehealth: Payer: Self-pay | Admitting: Cardiology

## 2019-06-06 NOTE — Telephone Encounter (Signed)
I am going to route this to Dan Humphreys, NP to review. It looks as if this patient has been taking the metoprolol 25 mg BID since 03/17/19. The Ranexa 500 mg BID was just prescribed on 05/30/19 and I do not see any mention of the metoprolol being stopped at that time.

## 2019-06-06 NOTE — Telephone Encounter (Signed)
Thanks for clarifying! The Metoprolol 25mg  looks like it was prescribed by another office. It is fine to take the Metoprolol 25mg  BID as well as Ranexa 500mg  BID. Her heart rate in clinic had adequate room for this. Simply educate her to call if she notices any new lightheadedness or dizziness while taking both medications. I do not anticipate this being an issue. Many people take both together without difficulty.   , NP

## 2019-06-06 NOTE — Telephone Encounter (Signed)
The metoprolol 100 mg tablet was called in for the CT but she also has Metoprolol 25 mg that she takes BID. I just called the patient to confirm this as well. She is wondering If she should take both the metoprolol and the Ranexa BID?

## 2019-06-06 NOTE — Telephone Encounter (Signed)
The Metoprolol was just a single dose for prior to her cardiac CT. The Ranexa is a daily medication.  She should fill both prescriptions. May need to call pharmacy to confirm.   Alver Sorrow, NP

## 2019-06-06 NOTE — Telephone Encounter (Signed)
Thank you! & thanks for following up with the pharmacy.  Alver Sorrow, NP

## 2019-06-06 NOTE — Telephone Encounter (Signed)
Spoke to patient just now and let her know Walker, NP's recommendation to take both of these medications. She verbalizes understanding of this and states that she will let us know if she experiences any new lightheadedness or dizziness.   She also told me that she was having trouble getting her metoprolol from the pharmacy because they did not want to give her both of these medications together. I called the pharmacy just now and verified with them that she will be taking both of these medications. They verbalized understanding of this and told me that they will fill it now.    Encouraged patient to call back with any questions or concerns.

## 2019-06-06 NOTE — Telephone Encounter (Signed)
Pt c/o medication issue:  1. Name of Medication:  ranolazine (RANEXA) 500 MG 12 hr tablet  metoprolol tartrate (LOPRESSOR) 25 MG tablet    2. How are you currently taking this medication (dosage and times per day)? Metoprolol 1 tablet twice a day, has not started ranexa  3. Are you having a reaction (difficulty breathing--STAT)? no  4. What is your medication issue? Patient states the pharmacy told her both medications are the same. She states she is unsure if she needs to take both or just take the ranexa since it is the new medication. Please advise.

## 2019-06-22 ENCOUNTER — Ambulatory Visit: Payer: Medicaid Other | Admitting: Cardiology

## 2019-06-27 ENCOUNTER — Telehealth: Payer: Self-pay | Admitting: Cardiology

## 2019-06-27 NOTE — Telephone Encounter (Signed)
Patient states she was returning a call to schedule CT. Patient hung up phone prior to transferring to Jean's voicemail to schedule CT.

## 2019-07-07 ENCOUNTER — Telehealth (HOSPITAL_COMMUNITY): Payer: Self-pay | Admitting: *Deleted

## 2019-07-07 NOTE — Telephone Encounter (Signed)
Attempted to call patient regarding upcoming cardiac CT appointment. Left message on voicemail with name and callback number  Kanav Kazmierczak Tai RN Navigator Cardiac Imaging [facility] [phone] Office [phone] Cell 

## 2019-07-07 NOTE — Telephone Encounter (Signed)
Pt returning call regarding upcoming cardiac imaging study; pt verbalizes understanding of appt date/time, parking situation and where to check in, pre-test NPO status and medications ordered, and verified current allergies; name and call back number provided for further questions should they arise ° °Randeep Biondolillo Tai RN Navigator Cardiac Imaging °[facility] °[phone] office °[phone] cell ° °

## 2019-07-10 ENCOUNTER — Ambulatory Visit (HOSPITAL_COMMUNITY)
Admission: RE | Admit: 2019-07-10 | Discharge: 2019-07-10 | Disposition: A | Discharge status: Home / Self Care | Payer: Medicaid Other | Source: Ambulatory Visit | Attending: Cardiology | Admitting: Cardiology

## 2019-07-10 ENCOUNTER — Other Ambulatory Visit: Payer: Self-pay

## 2019-07-10 ENCOUNTER — Telehealth: Payer: Self-pay

## 2019-07-10 DIAGNOSIS — R079 Chest pain, unspecified: Secondary | ICD-10-CM

## 2019-07-10 MED ORDER — IOHEXOL 350 MG/ML SOLN
80.0000 mL | Freq: Once | INTRAVENOUS | Status: AC | PRN
  Administered 2019-07-10: 80 mL via INTRAVENOUS

## 2019-07-10 MED ORDER — NITROGLYCERIN 0.4 MG SL SUBL: 0.8000 mg | SUBLINGUAL_TABLET | Freq: Once | SUBLINGUAL | Status: AC

## 2019-07-10 MED ORDER — NITROGLYCERIN 0.4 MG SL SUBL
SUBLINGUAL_TABLET | SUBLINGUAL | Status: AC
  Administered 2019-07-10: 0.8 mg via SUBLINGUAL
  Filled 2019-07-10: qty 2

## 2019-07-10 MED ORDER — METOPROLOL TARTRATE 5 MG/5ML IV SOLN
INTRAVENOUS | Status: AC
  Administered 2019-07-10: 5 mg via INTRAVENOUS
  Filled 2019-07-10: qty 20

## 2019-07-10 MED ORDER — METOPROLOL TARTRATE 5 MG/5ML IV SOLN
5.0000 mg | INTRAVENOUS | Status: DC | PRN
  Administered 2019-07-10: 5 mg via INTRAVENOUS

## 2019-07-10 NOTE — Telephone Encounter (Signed)
Left message on patients voicemail to please return our call.   

## 2019-07-10 NOTE — Telephone Encounter (Signed)
-----   Message from Baldo Daub, MD sent at 07/10/2019  4:22 PM EDT ----- Normal or stable result  Her cardiac CTA is normal

## 2019-07-11 NOTE — Telephone Encounter (Signed)
Spoke with patient regarding results and recommendation.  Patient verbalizes understanding and is agreeable to plan of care. Advised patient to call back with any issues or concerns.  

## 2019-09-12 ENCOUNTER — Other Ambulatory Visit: Payer: Self-pay

## 2019-09-12 ENCOUNTER — Encounter: Payer: Self-pay | Admitting: Cardiology

## 2019-09-12 ENCOUNTER — Ambulatory Visit (INDEPENDENT_AMBULATORY_CARE_PROVIDER_SITE_OTHER): Payer: Medicaid Other | Admitting: Cardiology

## 2019-09-12 ENCOUNTER — Ambulatory Visit: Payer: Medicaid Other | Admitting: Cardiology

## 2019-09-12 VITALS — BP 128/90 | HR 90 | Ht 62.0 in | Wt 194.0 lb

## 2019-09-12 DIAGNOSIS — Z8673 Personal history of transient ischemic attack (TIA), and cerebral infarction without residual deficits: Secondary | ICD-10-CM | POA: Diagnosis not present

## 2019-09-12 DIAGNOSIS — E669 Obesity, unspecified: Secondary | ICD-10-CM

## 2019-09-12 DIAGNOSIS — I10 Essential (primary) hypertension: Secondary | ICD-10-CM | POA: Diagnosis not present

## 2019-09-12 DIAGNOSIS — R0989 Other specified symptoms and signs involving the circulatory and respiratory systems: Secondary | ICD-10-CM

## 2019-09-12 DIAGNOSIS — R0789 Other chest pain: Secondary | ICD-10-CM

## 2019-09-12 MED ORDER — METOPROLOL TARTRATE 25 MG PO TABS: 25.0000 mg | ORAL_TABLET | Freq: Two times a day (BID) | ORAL | 1 refills | Status: DC

## 2019-09-12 NOTE — Patient Instructions (Signed)
Medication Instructions:  Your physician recommends that you continue on your current medications as directed. Please refer to the Current Medication list given to you today.  *If you need a refill on your cardiac medications before your next appointment, please call your pharmacy*   Lab Work: NONE ORDERED  TODAY   If you have labs (blood work) drawn today and your tests are completely normal, you will receive your results only by:  MyChart Message (if you have MyChart) OR  A paper copy in the mail If you have any lab test that is abnormal or we need to change your treatment, we will call you to review the results.   Testing/Procedures: Your physician has requested that you have an echocardiogram. Echocardiography is a painless test that uses sound waves to create images of your heart. It provides your doctor with information about the size and shape of your heart and how well your hearts chambers and valves are working. This procedure takes approximately one hour. There are no restrictions for this procedure.    Follow-Up: At [facility], you and your health needs are our priority.  As part of our continuing mission to provide you with exceptional heart care, we have created designated Provider Care Teams.  These Care Teams include your primary Cardiologist (physician) and Advanced Practice Providers (APPs -  Physician Assistants and Nurse Practitioners) who all work together to provide you with the care you need, when you need it.  We recommend signing up for the patient portal called "MyChart".  Sign up information is provided on this After Visit Summary.  MyChart is used to connect with patients for Virtual Visits (Telemedicine).  Patients are able to view lab/test results, encounter notes, upcoming appointments, etc.  Non-urgent messages can be sent to your provider as well.   To learn more about what you can do with MyChart, go to [email or web address].    Your next  appointment:   6 month(s)  The format for your next appointment:   In Person  Provider:   You will see Thomasene Ripple, DO.  Or, you can be scheduled with the following Advanced Practice Provider on your designated Care Team (at our [facility]):  Gillian Shields, FNP     Other Instructions

## 2019-09-12 NOTE — Progress Notes (Signed)
Cardiology Office Note:    Date:  09/12/2019   ID:  Stacie Marshall, DOB 28-Jun-1975, MRN [id number]  PCP:  Rocky Morel, MD  Cardiologist:  Berniece Salines, DO  Electrophysiologist:  None   Referring MD: Huey Romans*   Chief Complaint  Patient presents with  . Follow-up    History of Present Illness:    Stacie Williams Reynosois a 45 y.o.femalewith a hx of hypertension, hyperlipidemia, obesity, history of CVA, alcohol use and history of mild to moderate depressed ejection fraction with LVEF of 40 to 45%inAugust 2019.   I did see the patient in February 2021 after she was hospitalized [facility] for chest pain.  At that time she did undergo a stress test which showed no evidence of reversible ischemia however there were akinesis of the anteroseptal walls suggesting myocardial infarction. Ejection fraction 49%. At the time of discharge restart aspirin, statin as well as Lopressor and she was discharged home.  I saw her in May 2021 at that time she was experiencing chest pain I recommend the patient going get a coronary CTA.  She was able to get a coronary CTA which did not show any blockages.  Calcium score 0.  She is here today for follow-up visit.  She is here for follow-up visit today she tells me that she has been doing well from a cardiovascular standpoint.  She stopped her Ranexa since she had not been experiencing chest pain.  No other complaints at this time.  Past Medical History:  Diagnosis Date  . Acute blood loss anemia 08/20/2017  . Alcohol abuse   . Anemia   . HTN (hypertension)   . Iron deficiency anemia 08/20/2017  . Leukocytosis 08/20/2017  . 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 Obesity, Class II, BMI 35-39.9   . Smoker   . 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 Stroke ([place])   . UTI (urinary tract infection) 08/20/2017    Past Surgical History:  Procedure Laterality Date  . IR CT HEAD LTD   08/15/2017  . IR PERCUTANEOUS ART THROMBECTOMY/INFUSION INTRACRANIAL INC DIAG ANGIO  08/15/2017  . RADIOLOGY WITH ANESTHESIA N/A 08/15/2017   Procedure: RADIOLOGY WITH ANESTHESIA;  Surgeon: Luanne Bras, MD;  Location: [facility];  Service: Radiology;  Laterality: N/A;  . TEE WITHOUT CARDIOVERSION N/A 08/20/2017   Procedure: TRANSESOPHAGEAL ECHOCARDIOGRAM (TEE) WITH MAC;  Surgeon: Acie Fredrickson Wonda Cheng, MD;  Location: [facility];  Service: Cardiovascular;  Laterality: N/A;    Current Medications: Current Meds  Medication Sig  . acetaminophen (TYLENOL) 500 MG tablet Take 500 mg by mouth as needed.  Marland Kitchen aspirin EC 81 MG EC tablet Take 1 tablet (81 mg total) by mouth daily.  Marland Kitchen atorvastatin (LIPITOR) 40 MG tablet Take 40 mg by mouth daily.  . cyanocobalamin 1000 MCG tablet Take 1 tablet (1,000 mcg total) by mouth daily.  . ferrous sulfate 325 (65 FE) MG tablet Take 1 tablet (325 mg total) by mouth 3 (three) times daily with meals.  . fluticasone (FLONASE) 50 MCG/ACT nasal spray SHAKE LIQUID AND USE 2 SPRAYS IN EACH NOSTRIL DAILY  . gabapentin (NEURONTIN) 300 MG capsule Take 300 mg by mouth 3 (three) times daily.  Marland Kitchen ibuprofen (ADVIL) 800 MG tablet Take 800 mg by mouth daily.   . metoprolol tartrate (LOPRESSOR) 25 MG tablet Take 1 tablet (25 mg total) by mouth 2 (two) times daily.  . Multiple Vitamin (MULTIVITAMIN WITH MINERALS) TABS tablet Take 1 tablet  by mouth daily.  Marland Kitchen omeprazole (PRILOSEC) 40 MG capsule Take 40 mg by mouth daily.  . Vitamin D, Ergocalciferol, (DRISDOL) 1.25 MG (50000 UNIT) CAPS capsule Take 50,000 Units by mouth once a week.  . [DISCONTINUED] metoprolol tartrate (LOPRESSOR) 25 MG tablet Take 25 mg by mouth 2 (two) times daily.     Allergies:   Patient has no known allergies.   Social History   Socioeconomic History  . Marital status: Single    Spouse name: Not on file  . Number of children: Not on file  . Years of education: Not on file  . Highest education level: Not on  file  Occupational History  . Not on file  Tobacco Use  . Smoking status: Former Research scientist (life sciences)  . Smokeless tobacco: Never Used  Substance and Sexual Activity  . Alcohol use: Not Currently  . Drug use: Not Currently  . Sexual activity: Yes  Other Topics Concern  . Not on file  Social History Narrative  . Not on file   Social Determinants of Health   Financial Resource Strain:   . Difficulty of Paying Living Expenses: Not on file  Food Insecurity:   . Worried About Charity fundraiser in the Last Year: Not on file  . Ran Out of Food in the Last Year: Not on file  Transportation Needs:   . Lack of Transportation (Medical): Not on file  . Lack of Transportation (Non-Medical): Not on file  Physical Activity:   . Days of Exercise per Week: Not on file  . Minutes of Exercise per Session: Not on file  Stress:   . Feeling of Stress : Not on file  Social Connections:   . Frequency of Communication with Friends and Family: Not on file  . Frequency of Social Gatherings with Friends and Family: Not on file  . Attends Religious Services: Not on file  . Active Member of Clubs or Organizations: Not on file  . Attends Archivist Meetings: Not on file  . Marital Status: Not on file     Family History: The patient's family history includes Breast cancer in her maternal grandmother; Diabetes in her mother; Hypercholesterolemia in her maternal grandmother; Hypertension in her maternal grandmother and mother.  ROS:   Review of Systems  Constitution: Negative for decreased appetite, fever and weight gain.  HENT: Negative for congestion, ear discharge, hoarse voice and sore throat.   Eyes: Negative for discharge, redness, vision loss in right eye and visual halos.  Cardiovascular: Negative for chest pain, dyspnea on exertion, leg swelling, orthopnea and palpitations.  Respiratory: Negative for cough, hemoptysis, shortness of breath and snoring.   Endocrine: Negative for heat intolerance  and polyphagia.  Hematologic/Lymphatic: Negative for bleeding problem. Does not bruise/bleed easily.  Skin: Negative for flushing, nail changes, rash and suspicious lesions.  Musculoskeletal: Negative for arthritis, joint pain, muscle cramps, myalgias, neck pain and stiffness.  Gastrointestinal: Negative for abdominal pain, bowel incontinence, diarrhea and excessive appetite.  Genitourinary: Negative for decreased libido, genital sores and incomplete emptying.  Neurological: Negative for brief paralysis, focal weakness, headaches and loss of balance.  Psychiatric/Behavioral: Negative for altered mental status, depression and suicidal ideas.  Allergic/Immunologic: Negative for HIV exposure and persistent infections.    EKGs/Labs/Other Studies Reviewed:    The following studies were reviewed today:   EKG:  The ekg ordered today demonstrates   CCTA IMPRESSION: 1. Coronary calcium score of 0. This was 0 percentile for age and sex matched control.  2. Normal coronary origin with right dominance.  3. No evidence of CAD; CAD-RADS 0.    Recent Labs: 03/17/2019: TSH 1.940 05/30/2019: BUN 6; Creatinine, Ser 0.48; Potassium 4.6; Sodium 139  Recent Lipid Panel    Component Value Date/Time   CHOL 127 08/15/2017 0729   TRIG 208 (H) 08/15/2017 0729   TRIG 200 (H) 08/15/2017 0729   HDL 23 (L) 08/15/2017 0729   CHOLHDL 5.5 08/15/2017 0729   VLDL 42 (H) 08/15/2017 0729   LDLCALC 62 08/15/2017 0729    Physical Exam:    VS:  BP 128/90 (BP Location: Right Arm, Patient Position: Sitting, Cuff Size: Normal)   Pulse 90   Ht _0  (1.575 m)   Wt 194 lb (88 kg)   SpO2 99%   BMI 35.48 kg/m     Wt Readings from Last 3 Encounters:  09/12/19 194 lb (88 kg)  05/30/19 191 lb (86.6 kg)  03/17/19 190 lb (86.2 kg)     GEN: Well nourished, well developed in no acute distress HEENT: Normal NECK: No JVD; No carotid bruits LYMPHATICS: No lymphadenopathy CARDIAC: S1S2 noted,RRR, no murmurs,  rubs, gallops RESPIRATORY:  Clear to auscultation without rales, wheezing or rhonchi  ABDOMEN: Soft, non-tender, non-distended, +bowel sounds, no guarding. EXTREMITIES: No edema, No cyanosis, no clubbing MUSCULOSKELETAL:  No deformity  SKIN: Warm and dry NEUROLOGIC:  Alert and oriented x 3, non-focal PSYCHIATRIC:  Normal affect, good insight  ASSESSMENT:    1. History of CVA (cerebrovascular accident)   2. Essential hypertension   3. Obesity, Class II, BMI 35-39.9   4. Chest tightness   5. Depressed left ventricular ejection fraction    PLAN:     1.  Her coronary CTA showed 0 calcium and she did not have any soft plaques.  She is happy with her progression.  At this time there is no need for further ischemic evaluation.  She stopped her Ranexa tell me that she has not had any chest pain.  We will going to monitor her.  For history of depressed ejection fraction she is on Lopressor.  She has not had a repeat echocardiogram which was ordered back in February.  Can reorder this testing as I would like to see her EF.  She understands that this test needs to be done.  Also her medication will be sent to the pharmacy.  History of CVA continue patient on aspirin and statin.  The patient is in agreement with the above plan. The patient left the office in stable condition.  The patient will follow up in 6 months or sooner if needed.   Medication Adjustments/Labs and Tests Ordered: Current medicines are reviewed at length with the patient today.  Concerns regarding medicines are outlined above.  Orders Placed This Encounter  Procedures  . ECHOCARDIOGRAM COMPLETE   Meds ordered this encounter  Medications  . metoprolol tartrate (LOPRESSOR) 25 MG tablet    Sig: Take 1 tablet (25 mg total) by mouth 2 (two) times daily.    Dispense:  180 tablet    Refill:  1    Patient Instructions  Medication Instructions:  Your physician recommends that you continue on your current medications as  directed. Please refer to the Current Medication list given to you today.  *If you need a refill on your cardiac medications before your next appointment, please call your pharmacy*   Lab Work: [place]   If you have labs (blood work) drawn today and your tests  are completely normal, you will receive your results only by: Marland Kitchen MyChart Message (if you have MyChart) OR . A paper copy in the mail If you have any lab test that is abnormal or we need to change your treatment, we will call you to review the results.   Testing/Procedures: Your physician has requested that you have an echocardiogram. Echocardiography is a painless test that uses sound waves to create images of your heart. It provides your doctor with information about the size and shape of your heart and how well your heart's chambers and valves are working. This procedure takes approximately one hour. There are no restrictions for this procedure.    Follow-Up: At [facility], you and your health needs are our priority.  As part of our continuing mission to provide you with exceptional heart care, we have created designated Provider Care Teams.  These Care Teams include your primary Cardiologist (physician) and Advanced Practice Providers (APPs -  Physician Assistants and Nurse Practitioners) who all work together to provide you with the care you need, when you need it.  We recommend signing up for the patient portal called "MyChart".  Sign up information is provided on this After Visit Summary.  MyChart is used to connect with patients for Virtual Visits (Telemedicine).  Patients are able to view lab/test results, encounter notes, upcoming appointments, etc.  Non-urgent messages can be sent to your provider as well.   To learn more about what you can do with MyChart, go to [email or web address].    Your next appointment:   6 month(s)  The format for your next appointment:   In Person  Provider:   You will see  Berniece Salines, DO.  Or, you can be scheduled with the following Advanced Practice Provider on your designated Care Team (at our [facility]):  Laurann Montana, FNP     Other Instructions      Adopting a Healthy Lifestyle.  Know what a healthy weight is for you (roughly BMI <25) and aim to maintain this   Aim for 7+ servings of fruits and vegetables daily   65-80+ fluid ounces of water or unsweet tea for healthy kidneys   Limit to max 1 drink of alcohol per day; avoid smoking/tobacco   Limit animal fats in diet for cholesterol and heart health - choose grass fed whenever available   Avoid highly processed foods, and foods high in saturated/trans fats   Aim for low stress - take time to unwind and care for your mental health   Aim for 150 min of moderate intensity exercise weekly for heart health, and weights twice weekly for bone health   Aim for 7-9 hours of sleep daily   When it comes to diets, agreement about the perfect plan isnt easy to find, even among the experts. Experts at the [place] developed an idea known as the Healthy Eating Plate. Just imagine a plate divided into logical, healthy portions.   The emphasis is on diet quality:   Load up on vegetables and fruits - one-half of your plate: Aim for color and variety, and remember that potatoes dont count.   Go for whole grains - one-quarter of your plate: Whole wheat, barley, wheat berries, quinoa, oats, brown rice, and foods made with them. If you want pasta, go with whole wheat pasta.   Protein power - one-quarter of your plate: Fish, chicken, beans, and nuts are all healthy, versatile protein sources. Limit red meat.  The diet, however, does go beyond the plate, offering a few other suggestions.   Use healthy plant oils, such as olive, canola, soy, corn, sunflower and peanut. Check the labels, and avoid partially hydrogenated oil, which have unhealthy trans fats.   If youre  thirsty, drink water. Coffee and tea are good in moderation, but skip sugary drinks and limit milk and dairy products to one or two daily servings.   The type of carbohydrate in the diet is more important than the amount. Some sources of carbohydrates, such as vegetables, fruits, whole grains, and beans-are healthier than others.   Finally, stay active  Signed, Berniece Salines, DO  09/12/2019 1:51 PM    [facility]

## 2019-10-05 ENCOUNTER — Other Ambulatory Visit: Payer: Medicaid Other

## 2019-12-26 ENCOUNTER — Telehealth: Payer: Self-pay | Admitting: Cardiology

## 2019-12-26 DIAGNOSIS — Z8673 Personal history of transient ischemic attack (TIA), and cerebral infarction without residual deficits: Secondary | ICD-10-CM

## 2019-12-26 NOTE — Telephone Encounter (Signed)
Order has been placed.

## 2019-12-26 NOTE — Telephone Encounter (Signed)
Please update orders for the patient's Echocardiogram scheduled for 01/25/19. The patient needed to reschedule her Echo but the order expired 01/12/20

## 2020-01-25 ENCOUNTER — Other Ambulatory Visit: Payer: Self-pay

## 2020-01-25 ENCOUNTER — Ambulatory Visit (INDEPENDENT_AMBULATORY_CARE_PROVIDER_SITE_OTHER): Payer: Medicaid Other

## 2020-01-25 DIAGNOSIS — Z8673 Personal history of transient ischemic attack (TIA), and cerebral infarction without residual deficits: Secondary | ICD-10-CM | POA: Diagnosis not present

## 2020-01-25 LAB — ECHOCARDIOGRAM COMPLETE
Area-P 1/2: 4.1 cm2
S' Lateral: 3.4 cm

## 2020-01-25 NOTE — Progress Notes (Signed)
Complete echocardiogram performed.  Jimmy Elaiza Shoberg RDCS, RVT  

## 2020-01-26 ENCOUNTER — Telehealth: Payer: Self-pay

## 2020-01-26 NOTE — Telephone Encounter (Signed)
Unable to leave a message to return my call(VM full)

## 2020-01-26 NOTE — Telephone Encounter (Signed)
-----   Message from Kardie Tobb, DO sent at 01/26/2020 12:18 PM EST ----- Your echocardiogram shows some improvement of your heart function now 55 to 60%. 

## 2020-01-26 NOTE — Telephone Encounter (Signed)
-----   Message from Stacie Ripple, DO sent at 01/26/2020 12:18 PM EST ----- Your echocardiogram shows some improvement of your heart function now 55 to 60%.

## 2020-01-26 NOTE — Telephone Encounter (Signed)
Patient notified of results and verbalized understanding.  

## 2020-02-11 ENCOUNTER — Other Ambulatory Visit: Payer: Self-pay | Admitting: Cardiology

## 2020-02-12 NOTE — Telephone Encounter (Signed)
Refill sent to pharmacy.   

## 2020-03-14 ENCOUNTER — Ambulatory Visit (INDEPENDENT_AMBULATORY_CARE_PROVIDER_SITE_OTHER): Payer: Medicaid Other | Admitting: Cardiology

## 2020-03-14 ENCOUNTER — Encounter: Payer: Self-pay | Admitting: Cardiology

## 2020-03-14 ENCOUNTER — Other Ambulatory Visit: Payer: Self-pay

## 2020-03-14 VITALS — BP 126/88 | HR 88 | Ht 62.0 in | Wt 197.2 lb

## 2020-03-14 DIAGNOSIS — E669 Obesity, unspecified: Secondary | ICD-10-CM

## 2020-03-14 DIAGNOSIS — I10 Essential (primary) hypertension: Secondary | ICD-10-CM | POA: Diagnosis not present

## 2020-03-14 DIAGNOSIS — E782 Mixed hyperlipidemia: Secondary | ICD-10-CM

## 2020-03-14 DIAGNOSIS — R600 Localized edema: Secondary | ICD-10-CM

## 2020-03-14 DIAGNOSIS — Z8673 Personal history of transient ischemic attack (TIA), and cerebral infarction without residual deficits: Secondary | ICD-10-CM

## 2020-03-14 HISTORY — DX: Localized edema: R60.0

## 2020-03-14 HISTORY — DX: Obesity, unspecified: E66.9

## 2020-03-14 MED ORDER — FUROSEMIDE 20 MG PO TABS: 20.0000 mg | ORAL_TABLET | Freq: Every day | ORAL | 3 refills | Status: DC

## 2020-03-14 MED ORDER — POTASSIUM CHLORIDE ER 10 MEQ PO TBCR: 10.0000 meq | EXTENDED_RELEASE_TABLET | Freq: Every day | ORAL | 3 refills | Status: AC

## 2020-03-14 NOTE — Patient Instructions (Signed)
Medication Instructions:  Your physician has recommended you make the following change in your medication:  START: Lasix 20 mg daily as needed START: Potassium 10 meq daily with Lasix  *If you need a refill on your cardiac medications before your next appointment, please call your pharmacy*   Lab Work: Your physician recommends that you return for lab work:  TODAY: BMET, Mag  If you have labs (blood work) drawn today and your tests are completely normal, you will receive your results only by: Marland Kitchen MyChart Message (if you have MyChart) OR . A paper copy in the mail If you have any lab test that is abnormal or we need to change your treatment, we will call you to review the results.   Testing/Procedures: None   Follow-Up: At [facility], you and your health needs are our priority.  As part of our continuing mission to provide you with exceptional heart care, we have created designated Provider Care Teams.  These Care Teams include your primary Cardiologist (physician) and Advanced Practice Providers (APPs -  Physician Assistants and Nurse Practitioners) who all work together to provide you with the care you need, when you need it.  We recommend signing up for the patient portal called "MyChart".  Sign up information is provided on this After Visit Summary.  MyChart is used to connect with patients for Virtual Visits (Telemedicine).  Patients are able to view lab/test results, encounter notes, upcoming appointments, etc.  Non-urgent messages can be sent to your provider as well.   To learn more about what you can do with MyChart, go to [email or web address].    Your next appointment:   6 month(s)  The format for your next appointment:   In Person  Provider:   Thomasene Ripple, DO   Other Instructions

## 2020-03-14 NOTE — Progress Notes (Signed)
Cardiology Office Note:    Date:  03/14/2020   ID:  Stacie Marshall, DOB 28-Dec-1975, MRN [id number]  PCP:  Rocky Morel, MD  Cardiologist:  Berniece Salines, DO  Electrophysiologist:  None   Referring MD: Huey Romans*   I am doing fine  History of Present Illness:    Stacie Marshall is a 45 y.o. female with a hx of hypertension, hyperlipidemia, obesity, history of CVA, alcohol use and history of mild to moderate depressed ejection fraction with LVEF of 40 to 45%inAugust 2019.   I did see the patient in February 2021 after she was hospitalized [facility] for chest pain. At that time she did undergo a stress test which showed no evidence of reversible ischemia however there were akinesis of the anteroseptal walls suggesting myocardial infarction. Ejection fraction 49%. At the time of discharge restart aspirin, statin as well as Lopressor and she was discharged home.  I saw her in May 2021 at that time she was experiencing chest pain I recommend the patient going get a coronary CTA.  She was able to get a coronary CTA which did not show any blockages.  Calcium score 0.    At her last visit she has stopped the Ranexa and had not been experiencing any further chest pain.  She tells me that she know that she has been recently spacing bilateral leg edema more often now.  No other complaints at this time.  Past Medical History:  Diagnosis Date  . Acute blood loss anemia 08/20/2017  . Alcohol abuse   . Anemia   . Chest pain of uncertain etiology 6/44/0347  . Chest tightness 03/17/2019  . Depressed left ventricular ejection fraction 03/17/2019  . History of CVA (cerebrovascular accident) 03/17/2019  . HTN (hypertension)   . Iron deficiency anemia 08/20/2017  . Leukocytosis 08/20/2017  . 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 Mixed hyperlipidemia 03/17/2019  . Obesity, Class II, BMI 35-39.9   . Palpitations 03/17/2019  . Postcoital  bleeding 05/03/2019  . Smoker   . 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 Stroke ([place])   . UTI (urinary tract infection) 08/20/2017    Past Surgical History:  Procedure Laterality Date  . IR CT HEAD LTD  08/15/2017  . IR PERCUTANEOUS ART THROMBECTOMY/INFUSION INTRACRANIAL INC DIAG ANGIO  08/15/2017  . RADIOLOGY WITH ANESTHESIA N/A 08/15/2017   Procedure: RADIOLOGY WITH ANESTHESIA;  Surgeon: Luanne Bras, MD;  Location: [place];  Service: Radiology;  Laterality: N/A;  . TEE WITHOUT CARDIOVERSION N/A 08/20/2017   Procedure: TRANSESOPHAGEAL ECHOCARDIOGRAM (TEE) WITH MAC;  Surgeon: Acie Fredrickson Wonda Cheng, MD;  Location: [place] [facility];  Service: Cardiovascular;  Laterality: N/A;    Current Medications: Current Meds  Medication Sig  . acetaminophen (TYLENOL) 500 MG tablet Take 500 mg by mouth as needed.  Marland Kitchen aspirin EC 81 MG EC tablet Take 1 tablet (81 mg total) by mouth daily.  Marland Kitchen atorvastatin (LIPITOR) 40 MG tablet Take 40 mg by mouth daily.  . cyanocobalamin 1000 MCG tablet Take 1 tablet (1,000 mcg total) by mouth daily.  . ferrous sulfate 325 (65 FE) MG tablet Take 1 tablet (325 mg total) by mouth 3 (three) times daily with meals.  . fluticasone (FLONASE) 50 MCG/ACT nasal spray SHAKE LIQUID AND USE 2 SPRAYS IN EACH NOSTRIL DAILY  . furosemide (LASIX) 20 MG tablet Take 1 tablet (20 mg total) by mouth daily.  Marland Kitchen gabapentin (NEURONTIN) 300 MG capsule Take 300 mg  by mouth 3 (three) times daily.  Marland Kitchen ibuprofen (ADVIL) 800 MG tablet Take 800 mg by mouth daily.   . metoprolol tartrate (LOPRESSOR) 25 MG tablet TAKE 1 TABLET(25 MG) BY MOUTH TWICE DAILY  . Multiple Vitamin (MULTIVITAMIN WITH MINERALS) TABS tablet Take 1 tablet by mouth daily.  Marland Kitchen omeprazole (PRILOSEC) 40 MG capsule Take 40 mg by mouth daily.  . potassium chloride (KLOR-CON) 10 MEQ tablet Take 1 tablet (10 mEq total) by mouth daily.  . ranolazine (RANEXA) 500 MG 12 hr tablet Take 1 tablet (500 mg total) by mouth 2 (two) times  daily.  . Vitamin D, Ergocalciferol, (DRISDOL) 1.25 MG (50000 UNIT) CAPS capsule Take 50,000 Units by mouth once a week.     Allergies:   Patient has no known allergies.   Social History   Socioeconomic History  . Marital status: Single    Spouse name: Not on file  . Number of children: Not on file  . Years of education: Not on file  . Highest education level: Not on file  Occupational History  . Not on file  Tobacco Use  . Smoking status: Former Research scientist (life sciences)  . Smokeless tobacco: Never Used  Substance and Sexual Activity  . Alcohol use: Not Currently  . Drug use: Not Currently  . Sexual activity: Yes  Other Topics Concern  . Not on file  Social History Narrative  . Not on file   Social Determinants of Health   Financial Resource Strain: Not on file  Food Insecurity: Not on file  Transportation Needs: Not on file  Physical Activity: Not on file  Stress: Not on file  Social Connections: Not on file     Family History: The patient's family history includes Breast cancer in her maternal grandmother; Diabetes in her mother; Hypercholesterolemia in her maternal grandmother; Hypertension in her maternal grandmother and mother.  ROS:   Review of Systems  Constitution: Negative for decreased appetite, fever and weight gain.  HENT: Negative for congestion, ear discharge, hoarse voice and sore throat.   Eyes: Negative for discharge, redness, vision loss in right eye and visual halos.  Cardiovascular: Negative for chest pain, dyspnea on exertion, leg swelling, orthopnea and palpitations.  Respiratory: Negative for cough, hemoptysis, shortness of breath and snoring.   Endocrine: Negative for heat intolerance and polyphagia.  Hematologic/Lymphatic: Negative for bleeding problem. Does not bruise/bleed easily.  Skin: Negative for flushing, nail changes, rash and suspicious lesions.  Musculoskeletal: Negative for arthritis, joint pain, muscle cramps, myalgias, neck pain and stiffness.   Gastrointestinal: Negative for abdominal pain, bowel incontinence, diarrhea and excessive appetite.  Genitourinary: Negative for decreased libido, genital sores and incomplete emptying.  Neurological: Negative for brief paralysis, focal weakness, headaches and loss of balance.  Psychiatric/Behavioral: Negative for altered mental status, depression and suicidal ideas.  Allergic/Immunologic: Negative for HIV exposure and persistent infections.    EKGs/Labs/Other Studies Reviewed:    The following studies were reviewed today:   EKG: None today  Transthoracic echocardiogram done on January 2022 IMPRESSIONS    1. Left ventricular ejection fraction, by estimation, is 55 to 60%. The  left ventricle has normal function. The left ventricle has no regional  wall motion abnormalities. There is mild concentric left ventricular  hypertrophy. Left ventricular diastolic  parameters are consistent with Grade I diastolic dysfunction (impaired  relaxation).  2. Right ventricular systolic function is normal. The right ventricular  size is normal. There is normal pulmonary artery systolic pressure.  3. The mitral valve is normal  in structure. No evidence of mitral valve  regurgitation. No evidence of mitral stenosis.  4. The aortic valve is normal in structure. Aortic valve regurgitation is  not visualized. No aortic stenosis is present.  5. The inferior vena cava is normal in size with greater than 50%  respiratory variability, suggesting right atrial pressure of 3 mmHg.   FINDINGS  Left Ventricle: Left ventricular ejection fraction, by estimation, is 55  to 60%. The left ventricle has normal function. The left ventricle has no  regional wall motion abnormalities. The left ventricular internal cavity  size was normal in size. There is  mild concentric left ventricular hypertrophy. Left ventricular diastolic  parameters are consistent with Grade I diastolic dysfunction (impaired   relaxation).   Right Ventricle: The right ventricular size is normal. No increase in  right ventricular wall thickness. Right ventricular systolic function is  normal. There is normal pulmonary artery systolic pressure. The tricuspid  regurgitant velocity is 1.89 m/s, and  with an assumed right atrial pressure of 3 mmHg, the estimated right  ventricular systolic pressure is 49.7 mmHg.   Left Atrium: Left atrial size was normal in size.   Right Atrium: Right atrial size was normal in size.   Pericardium: There is no evidence of pericardial effusion.   Mitral Valve: The mitral valve is normal in structure. No evidence of  mitral valve regurgitation. No evidence of mitral valve stenosis.   Tricuspid Valve: The tricuspid valve is normal in structure. Tricuspid  valve regurgitation is not demonstrated. No evidence of tricuspid  stenosis.   Aortic Valve: The aortic valve is normal in structure. Aortic valve  regurgitation is not visualized. No aortic stenosis is present.   Pulmonic Valve: The pulmonic valve was normal in structure. Pulmonic valve  regurgitation is not visualized. No evidence of pulmonic stenosis.   Aorta: The aortic root is normal in size and structure.   Venous: The inferior vena cava is normal in size with greater than 50%  respiratory variability, suggesting right atrial pressure of 3 mmHg.   IAS/Shunts: No atrial level shunt detected by color flow Doppler.   Recent Labs: 03/17/2019: TSH 1.940 05/30/2019: BUN 6; Creatinine, Ser 0.48; Potassium 4.6; Sodium 139  Recent Lipid Panel    Component Value Date/Time   CHOL 127 08/15/2017 0729   TRIG 208 (H) 08/15/2017 0729   TRIG 200 (H) 08/15/2017 0729   HDL 23 (L) 08/15/2017 0729   CHOLHDL 5.5 08/15/2017 0729   VLDL 42 (H) 08/15/2017 0729   LDLCALC 62 08/15/2017 0729    Physical Exam:    VS:  BP 126/88   Pulse 88   Ht _0  (1.575 m)   Wt 197 lb 3.2 oz (89.4 kg)   SpO2 99%   BMI 36.07 kg/m     Wt  Readings from Last 3 Encounters:  03/14/20 197 lb 3.2 oz (89.4 kg)  09/12/19 194 lb (88 kg)  05/30/19 191 lb (86.6 kg)     GEN: Well nourished, well developed in no acute distress HEENT: Normal NECK: No JVD; No carotid bruits LYMPHATICS: No lymphadenopathy CARDIAC: S1S2 noted,RRR, no murmurs, rubs, gallops RESPIRATORY:  Clear to auscultation without rales, wheezing or rhonchi  ABDOMEN: Soft, non-tender, non-distended, +bowel sounds, no guarding. EXTREMITIES: Bilateral leg edema edema, No cyanosis, no clubbing MUSCULOSKELETAL:  No deformity  SKIN: Warm and dry NEUROLOGIC:  Alert and oriented x 3, non-focal PSYCHIATRIC:  Normal affect, good insight  ASSESSMENT:    1. Bilateral leg edema  2. Primary hypertension   3. History of CVA (cerebrovascular accident)   4. Mixed hyperlipidemia   5. Obesity (BMI 30-39.9)    PLAN:     She does have some bilateral leg edema like to start the patient on low-dose Lasix 20 mg daily as needed for swelling with potassium supplement.  We will get blood work today to assess her electrolytes.  I reviewed her echocardiogram with her which was done in January which showed an improved ejection fraction of 55 to 60%.  The patient understands the need to lose weight with diet and exercise. We have discussed specific strategies for this.  She will continue on her aspirin and statin due to history of previous stroke.   The patient is in agreement with the above plan. The patient left the office in stable condition.  The patient will follow up in 6 months or sooner if needed.   Medication Adjustments/Labs and Tests Ordered: Current medicines are reviewed at length with the patient today.  Concerns regarding medicines are outlined above.  Orders Placed This Encounter  Procedures  . Basic metabolic panel  . Magnesium   Meds ordered this encounter  Medications  . furosemide (LASIX) 20 MG tablet    Sig: Take 1 tablet (20 mg total) by mouth daily.     Dispense:  90 tablet    Refill:  3  . potassium chloride (KLOR-CON) 10 MEQ tablet    Sig: Take 1 tablet (10 mEq total) by mouth daily.    Dispense:  90 tablet    Refill:  3    Patient Instructions  Medication Instructions:  Your physician has recommended you make the following change in your medication:  START: Lasix 20 mg daily as needed START: Potassium 10 meq daily with Lasix  *If you need a refill on your cardiac medications before your next appointment, please call your pharmacy*   Lab Work: Your physician recommends that you return for lab work:  TODAY: BMET, [place]  If you have labs (blood work) drawn today and your tests are completely normal, you will receive your results only by: Marland Kitchen MyChart Message (if you have MyChart) OR . A paper copy in the mail If you have any lab test that is abnormal or we need to change your treatment, we will call you to review the results.   Testing/Procedures: None   Follow-Up: At [facility] you and your health needs are our priority.  As part of our continuing mission to provide you with exceptional heart care, we have created designated Provider Care Teams.  These Care Teams include your primary Cardiologist (physician) and Advanced Practice Providers (APPs -  Physician Assistants and Nurse Practitioners) who all work together to provide you with the care you need, when you need it.  We recommend signing up for the patient portal called "MyChart".  Sign up information is provided on this After Visit Summary.  MyChart is used to connect with patients for Virtual Visits (Telemedicine).  Patients are able to view lab/test results, encounter notes, upcoming appointments, etc.  Non-urgent messages can be sent to your provider as well.   To learn more about what you can do with MyChart, go to [email or web address].    Your next appointment:   6 month(s)  The format for your next appointment:   In Person  Provider:   Berniece Salines,  DO   Other Instructions      Adopting a Healthy Lifestyle.  Know what a healthy weight is  for you (roughly BMI <25) and aim to maintain this   Aim for 7+ servings of fruits and vegetables daily   65-80+ fluid ounces of water or unsweet tea for healthy kidneys   Limit to max 1 drink of alcohol per day; avoid smoking/tobacco   Limit animal fats in diet for cholesterol and heart health - choose grass fed whenever available   Avoid highly processed foods, and foods high in saturated/trans fats   Aim for low stress - take time to unwind and care for your mental health   Aim for 150 min of moderate intensity exercise weekly for heart health, and weights twice weekly for bone health   Aim for 7-9 hours of sleep daily   When it comes to diets, agreement about the perfect plan isnt easy to find, even among the experts. Experts at the [employer] developed an idea known as the Healthy Eating Plate. Just imagine a plate divided into logical, healthy portions.   The emphasis is on diet quality:   Load up on vegetables and fruits - one-half of your plate: Aim for color and variety, and remember that potatoes dont count.   Go for whole grains - one-quarter of your plate: Whole wheat, barley, wheat berries, quinoa, oats, brown rice, and foods made with them. If you want pasta, go with whole wheat pasta.   Protein power - one-quarter of your plate: Fish, chicken, beans, and nuts are all healthy, versatile protein sources. Limit red meat.   The diet, however, does go beyond the plate, offering a few other suggestions.   Use healthy plant oils, such as olive, canola, soy, corn, sunflower and peanut. Check the labels, and avoid partially hydrogenated oil, which have unhealthy trans fats.   If youre thirsty, drink water. Coffee and tea are good in moderation, but skip sugary drinks and limit milk and dairy products to one or two daily servings.   The type of  carbohydrate in the diet is more important than the amount. Some sources of carbohydrates, such as vegetables, fruits, whole grains, and beans-are healthier than others.   Finally, stay active  Signed, Berniece Salines, DO  03/14/2020 1:42 PM    [facility]

## 2020-03-15 LAB — BASIC METABOLIC PANEL
BUN/Creatinine Ratio: 9 (ref 9–23)
BUN: 5 mg/dL — ABNORMAL LOW (ref 6–24)
CO2: 21 mmol/L (ref 20–29)
Calcium: 9.5 mg/dL (ref 8.7–10.2)
Chloride: 104 mmol/L (ref 96–106)
Creatinine, Ser: 0.55 mg/dL — ABNORMAL LOW (ref 0.57–1.00)
GFR calc Af Amer: 133 mL/min/{1.73_m2} (ref >59)
GFR calc non Af Amer: 115 mL/min/{1.73_m2} (ref >59)
Glucose: 111 mg/dL — ABNORMAL HIGH (ref 65–99)
Potassium: 4.1 mmol/L (ref 3.5–5.2)
Sodium: 140 mmol/L (ref 134–144)

## 2020-03-15 LAB — MAGNESIUM: Magnesium: 1.9 mg/dL (ref 1.6–2.3)

## 2020-03-20 ENCOUNTER — Telehealth: Payer: Self-pay | Admitting: Cardiology

## 2020-03-20 NOTE — Telephone Encounter (Signed)
Patient returning call for lab results. 

## 2020-03-27 ENCOUNTER — Telehealth: Payer: Self-pay

## 2020-03-27 NOTE — Telephone Encounter (Signed)
Spoke with patient regarding results and recommendation.  Patient verbalizes understanding and is agreeable to plan of care. Advised patient to call back with any issues or concerns.  

## 2020-03-27 NOTE — Telephone Encounter (Signed)
-----   Message from Thomasene Ripple, DO sent at 03/15/2020  5:04 PM EST ----- Labs stable

## 2020-05-25 ENCOUNTER — Other Ambulatory Visit: Payer: Self-pay | Admitting: Cardiology

## 2020-09-13 ENCOUNTER — Other Ambulatory Visit: Payer: Self-pay

## 2020-09-17 ENCOUNTER — Ambulatory Visit: Payer: Medicaid Other | Admitting: Cardiology

## 2021-02-17 IMAGING — CT CT HEART MORP W/ CTA COR W/ SCORE W/ CA W/CM &/OR W/O CM
4 of 7 series · 8 of 20 positions shown, 9 images · IV contrast (APPLIED)
Comparison: None.
COMPARISON: None.

Addendum:
EXAM:
OVER-READ INTERPRETATION  CT CHEST

The following report is an over-read performed by radiologist Dr.
Lorenz Jumper [REDACTED] on 07/10/2019. This
over-read does not include interpretation of cardiac or coronary
anatomy or pathology. The coronary calcium score/coronary CTA
interpretation by the cardiologist is attached.
CLINICAL DATA: 43 yo female with chest pain.
Cardiac/Coronary  CT
TECHNIQUE: The patient was scanned on a Phillips Force scanner.

[Series 6: best diast · axial · 0.39mm/px · z∈[-199,-163]mm · 2 of 271 slices shown, 3 images]
[im 91/271  vessel]
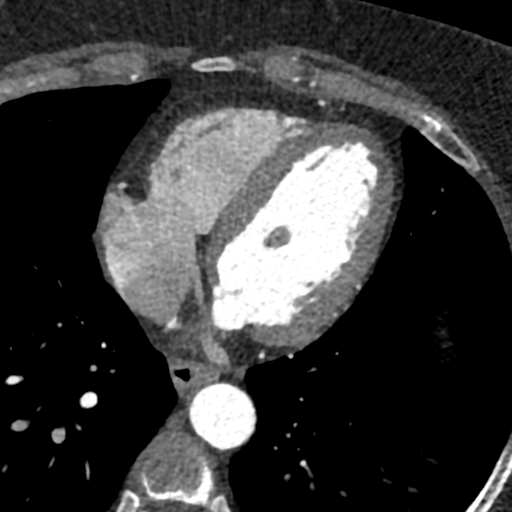
[im 91/271  lung]
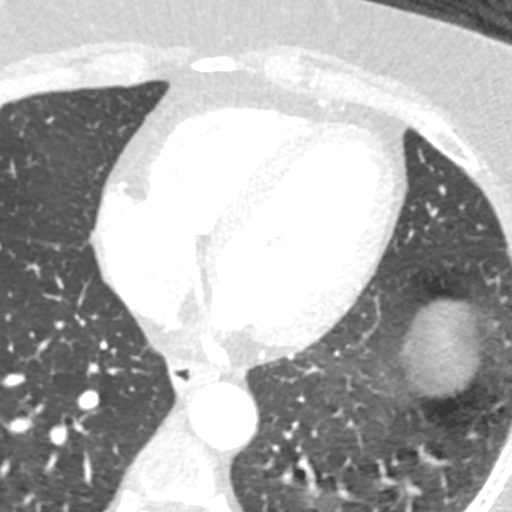
[im 181/271  vessel]
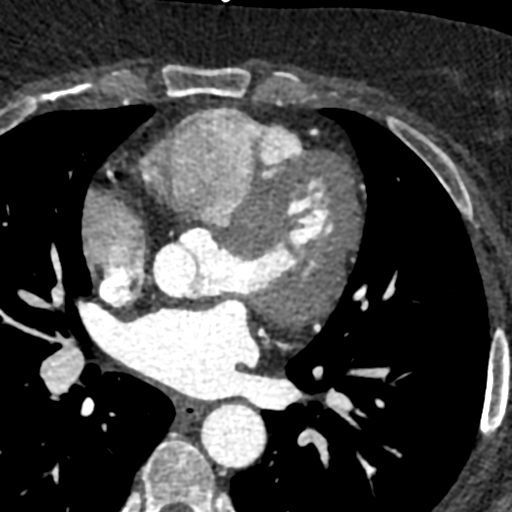

[Series 7: best syst · axial · 0.39mm/px · z∈[-199,-163]mm · 2 of 271 slices shown]
[im 91/271  vessel]
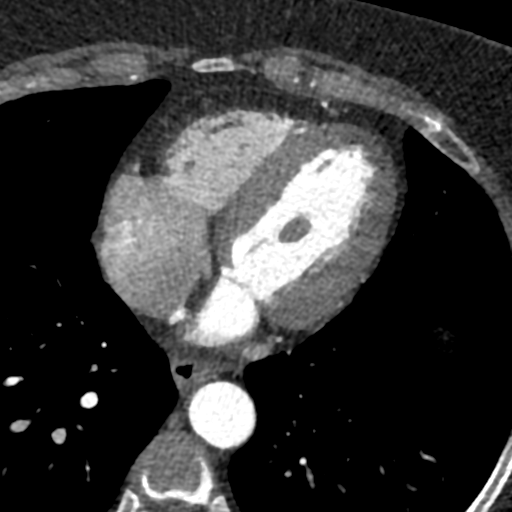
[im 181/271  vessel]
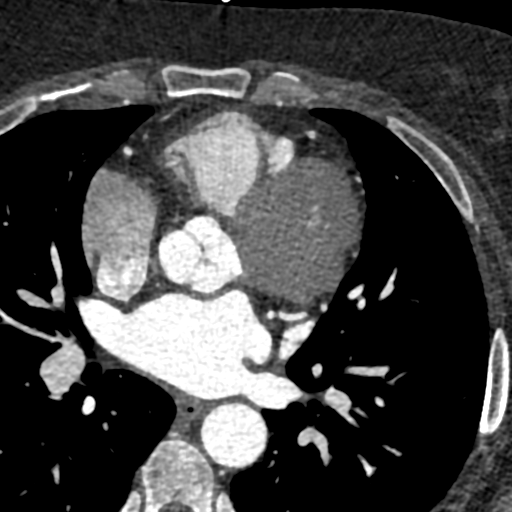

[Series 8: ts diast sharp 73 % · axial · 0.39mm/px · z∈[-199,-163]mm · 2 of 271 slices shown]
[im 91/271  lung]
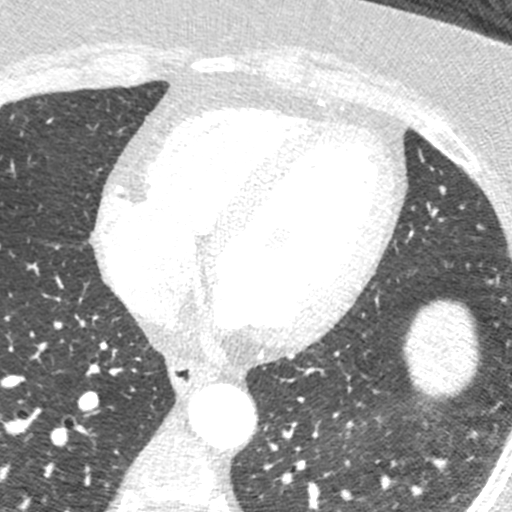
[im 181/271  lung]
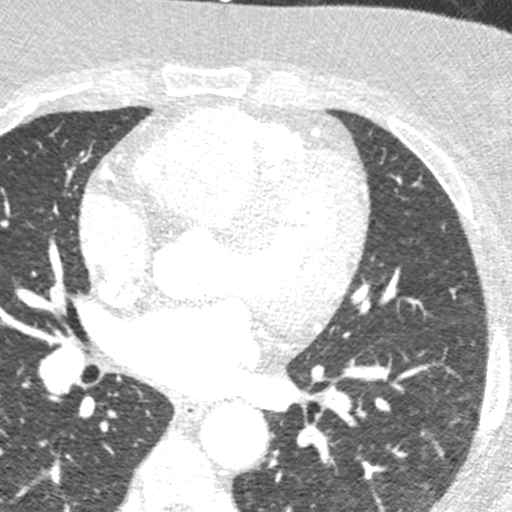

[Series 9: ts syst sharp · axial · 0.39mm/px · z∈[-199,-163]mm · 2 of 271 slices shown]
[im 91/271  lung]
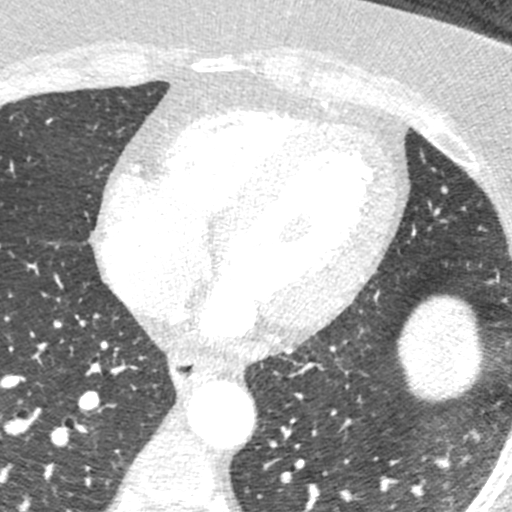
[im 181/271  lung]
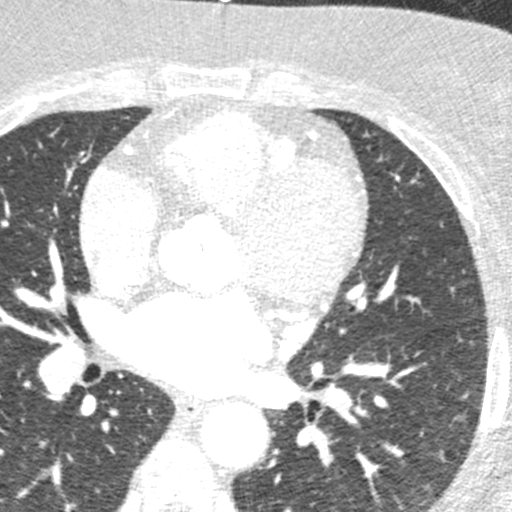

[8 of 20 positions shown; findings below may reference images not displayed]

FINDINGS: Within the visualized portions of the thorax there are no suspicious
appearing pulmonary nodules or masses, there is no acute
consolidative airspace disease, no pleural effusions, no
pneumothorax and no lymphadenopathy. Visualized portions of the
upper abdomen are unremarkable. There are no aggressive appearing
lytic or blastic lesions noted in the visualized portions of the
skeleton.
IMPRESSION: 1. No significant incidental noncardiac findings are noted.
FINDINGS: A 120 kV prospective scan was triggered in the descending thoracic
aorta at 111 HU's. Axial non-contrast 3 mm slices were carried out
through the heart. The data set was analyzed on a dedicated work
station and scored using the Agatson method. Gantry rotation speed
was 250 msecs and collimation was .6 mm. No beta blockade and 0.8 mg
of sl NTG was given. The 3D data set was reconstructed in 5%
intervals of the 67-82 % of the R-R cycle. Diastolic phases were
analyzed on a dedicated work station using MPR, MIP and VRT modes.
The patient received 80 cc of contrast.

Aorta:  Normal size.  No calcifications.  No dissection.

Aortic Valve:  Trileaflet.  No calcifications.

Coronary Arteries:  Normal coronary origin.  Right dominance.

RCA is a dominant artery that gives rise to small PDA. There is no
plaque.

Left main is a large artery that gives rise to LAD and LCX arteries.

LAD is a large vessel that gives rise to 2 small diagonals; there is
no plaque.

LCX is a non-dominant artery that gives rise to large branching OM1
and small OM2. There is no plaque.

Other findings:

Normal pulmonary vein drainage into the left atrium.

Normal let atrial appendage without a thrombus.

Normal size of the pulmonary artery.
IMPRESSION: 1. Coronary calcium score of 0. This was 0 percentile for age and
sex matched control.

2. Normal coronary origin with right dominance.

3. No evidence of CAD; CAD-RADS 0.

Di Solar

*** End of Addendum ***
EXAM:
OVER-READ INTERPRETATION  CT CHEST

The following report is an over-read performed by radiologist Dr.
Lorenz Jumper [REDACTED] on 07/10/2019. This
over-read does not include interpretation of cardiac or coronary
anatomy or pathology. The coronary calcium score/coronary CTA
interpretation by the cardiologist is attached.
FINDINGS: Within the visualized portions of the thorax there are no suspicious
appearing pulmonary nodules or masses, there is no acute
consolidative airspace disease, no pleural effusions, no
pneumothorax and no lymphadenopathy. Visualized portions of the
upper abdomen are unremarkable. There are no aggressive appearing
lytic or blastic lesions noted in the visualized portions of the
skeleton.
IMPRESSION: 1. No significant incidental noncardiac findings are noted.

## 2021-11-12 ENCOUNTER — Ambulatory Visit: Payer: Medicaid Other | Admitting: Cardiology

## 2021-11-27 ENCOUNTER — Ambulatory Visit: Payer: Medicaid Other | Attending: Cardiology | Admitting: Cardiology

## 2021-12-01 ENCOUNTER — Encounter: Payer: Self-pay | Admitting: Cardiology
